# Patient Record
Sex: Male | Born: 1980 | Race: Black or African American | Hispanic: No | Marital: Single | Smoking: Current every day smoker
Health system: Southern US, Community
[De-identification: ages and names within clinical notes are randomized; demographics above are authoritative.]

## PROBLEM LIST (undated history)

## (undated) DIAGNOSIS — J45909 Unspecified asthma, uncomplicated: Secondary | ICD-10-CM

---

## 2001-09-22 ENCOUNTER — Emergency Department (HOSPITAL_COMMUNITY): Admission: EM | Admit: 2001-09-22 | Discharge: 2001-09-22 | Payer: Self-pay | Admitting: *Deleted

## 2001-10-05 ENCOUNTER — Encounter: Admission: RE | Admit: 2001-10-05 | Discharge: 2001-10-05 | Payer: Self-pay | Admitting: Internal Medicine

## 2001-10-05 ENCOUNTER — Ambulatory Visit (HOSPITAL_COMMUNITY): Admission: RE | Admit: 2001-10-05 | Discharge: 2001-10-05 | Payer: Self-pay | Admitting: Internal Medicine

## 2002-12-10 ENCOUNTER — Encounter: Payer: Self-pay | Admitting: Emergency Medicine

## 2002-12-10 ENCOUNTER — Emergency Department (HOSPITAL_COMMUNITY): Admission: EM | Admit: 2002-12-10 | Discharge: 2002-12-10 | Payer: Self-pay | Admitting: Emergency Medicine

## 2005-09-26 ENCOUNTER — Emergency Department (HOSPITAL_COMMUNITY): Admission: EM | Admit: 2005-09-26 | Discharge: 2005-09-26 | Payer: Self-pay | Admitting: Family Medicine

## 2006-02-10 ENCOUNTER — Inpatient Hospital Stay (HOSPITAL_COMMUNITY): Admission: AC | Admit: 2006-02-10 | Discharge: 2006-02-10 | Payer: Self-pay

## 2006-04-22 ENCOUNTER — Emergency Department (HOSPITAL_COMMUNITY): Admission: EM | Admit: 2006-04-22 | Discharge: 2006-04-22 | Payer: Self-pay | Admitting: Emergency Medicine

## 2007-03-29 ENCOUNTER — Emergency Department (HOSPITAL_COMMUNITY): Admission: EM | Admit: 2007-03-29 | Discharge: 2007-03-29 | Payer: Self-pay | Admitting: Emergency Medicine

## 2007-12-12 ENCOUNTER — Emergency Department (HOSPITAL_COMMUNITY): Admission: EM | Admit: 2007-12-12 | Discharge: 2007-12-12 | Payer: Self-pay | Admitting: Emergency Medicine

## 2008-01-03 ENCOUNTER — Emergency Department (HOSPITAL_COMMUNITY): Admission: EM | Admit: 2008-01-03 | Discharge: 2008-01-03 | Payer: Self-pay | Admitting: Emergency Medicine

## 2008-08-12 ENCOUNTER — Emergency Department (HOSPITAL_COMMUNITY): Admission: EM | Admit: 2008-08-12 | Discharge: 2008-08-12 | Payer: Self-pay | Admitting: Nurse Practitioner

## 2009-03-24 ENCOUNTER — Emergency Department (HOSPITAL_COMMUNITY): Admission: EM | Admit: 2009-03-24 | Discharge: 2009-03-24 | Payer: Self-pay | Admitting: Emergency Medicine

## 2009-04-08 ENCOUNTER — Emergency Department (HOSPITAL_COMMUNITY): Admission: EM | Admit: 2009-04-08 | Discharge: 2009-04-08 | Payer: Self-pay | Admitting: Emergency Medicine
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
## 2010-12-27 ENCOUNTER — Emergency Department (HOSPITAL_COMMUNITY): Payer: Self-pay

## 2010-12-27 ENCOUNTER — Emergency Department (HOSPITAL_COMMUNITY)
Admission: EM | Admit: 2010-12-27 | Discharge: 2010-12-27 | Disposition: A | Discharge status: Home / Self Care | Payer: Self-pay | Attending: Emergency Medicine | Admitting: Emergency Medicine

## 2010-12-27 DIAGNOSIS — Y9367 Activity, basketball: Secondary | ICD-10-CM | POA: Insufficient documentation

## 2010-12-27 DIAGNOSIS — M545 Low back pain, unspecified: Secondary | ICD-10-CM | POA: Insufficient documentation

## 2010-12-27 DIAGNOSIS — M549 Dorsalgia, unspecified: Secondary | ICD-10-CM | POA: Insufficient documentation

## 2010-12-27 DIAGNOSIS — M542 Cervicalgia: Secondary | ICD-10-CM | POA: Insufficient documentation

## 2010-12-27 DIAGNOSIS — J45909 Unspecified asthma, uncomplicated: Secondary | ICD-10-CM | POA: Insufficient documentation

## 2010-12-27 DIAGNOSIS — X58XXXA Exposure to other specified factors, initial encounter: Secondary | ICD-10-CM | POA: Insufficient documentation

## 2011-02-18 LAB — COMPREHENSIVE METABOLIC PANEL
Alkaline Phosphatase: 51
BUN: 6
CO2: 22
Chloride: 110
GFR calc non Af Amer: >60
Glucose, Bld: 96
Potassium: 4.3
Total Bilirubin: 0.7

## 2011-02-18 LAB — DIFFERENTIAL
Basophils Relative: 0
Eosinophils Absolute: 0.3
Monocytes Relative: 7
Neutrophils Relative %: 73

## 2011-02-18 LAB — LACTIC ACID, PLASMA: Lactic Acid, Venous: 1.9

## 2011-02-18 LAB — LIPASE, BLOOD: Lipase: 18

## 2011-02-18 LAB — ETHANOL

## 2011-02-18 LAB — CBC
Hemoglobin: 16
RBC: 4.88

## 2014-04-24 ENCOUNTER — Emergency Department (HOSPITAL_COMMUNITY): Payer: Self-pay

## 2014-04-24 ENCOUNTER — Emergency Department (HOSPITAL_COMMUNITY)
Admission: EM | Admit: 2014-04-24 | Discharge: 2014-04-24 | Disposition: A | Discharge status: Home / Self Care | Payer: Self-pay | Attending: Emergency Medicine | Admitting: Emergency Medicine

## 2014-04-24 ENCOUNTER — Encounter (HOSPITAL_COMMUNITY): Payer: Self-pay | Admitting: *Deleted

## 2014-04-24 DIAGNOSIS — J302 Other seasonal allergic rhinitis: Secondary | ICD-10-CM | POA: Insufficient documentation

## 2014-04-24 DIAGNOSIS — R0602 Shortness of breath: Secondary | ICD-10-CM

## 2014-04-24 DIAGNOSIS — J45901 Unspecified asthma with (acute) exacerbation: Secondary | ICD-10-CM | POA: Insufficient documentation

## 2014-04-24 DIAGNOSIS — Z720 Tobacco use: Secondary | ICD-10-CM | POA: Insufficient documentation

## 2014-04-24 DIAGNOSIS — Z79899 Other long term (current) drug therapy: Secondary | ICD-10-CM | POA: Insufficient documentation

## 2014-04-24 HISTORY — DX: Unspecified asthma, uncomplicated: J45.909

## 2014-04-24 MED ORDER — PREDNISONE 20 MG PO TABS
60.0000 mg | ORAL_TABLET | Freq: Once | ORAL | Status: AC
  Administered 2014-04-24: 60 mg via ORAL
  Filled 2014-04-24: qty 3

## 2014-04-24 MED ORDER — ALBUTEROL SULFATE (2.5 MG/3ML) 0.083% IN NEBU
2.5000 mg | INHALATION_SOLUTION | Freq: Once | RESPIRATORY_TRACT | Status: AC
  Administered 2014-04-24: 2.5 mg via RESPIRATORY_TRACT
  Filled 2014-04-24: qty 3

## 2014-04-24 MED ORDER — ALBUTEROL SULFATE HFA 108 (90 BASE) MCG/ACT IN AERS
1.0000 | INHALATION_SPRAY | RESPIRATORY_TRACT | Status: DC | PRN
  Administered 2014-04-24: 2 via RESPIRATORY_TRACT
  Filled 2014-04-24: qty 6.7

## 2014-04-24 MED ORDER — LORATADINE 10 MG PO TABS: 10.0000 mg | ORAL_TABLET | Freq: Every day | ORAL | Status: DC

## 2014-04-24 MED ORDER — PREDNISONE 20 MG PO TABS: ORAL_TABLET | ORAL | Status: DC

## 2014-04-24 MED ORDER — BENZONATATE 100 MG PO CAPS: 100.0000 mg | ORAL_CAPSULE | Freq: Three times a day (TID) | ORAL | Status: DC

## 2014-04-24 MED ORDER — LORATADINE 10 MG PO TABS
10.0000 mg | ORAL_TABLET | Freq: Once | ORAL | Status: AC
  Administered 2014-04-24: 10 mg via ORAL
  Filled 2014-04-24: qty 1

## 2014-04-24 NOTE — ED Notes (Signed)
The pt has a cold and a cough since this am.  He  Has run out of his inhaler.  When he lies down he cannot breathe.  Pain in chest with inspiration sinus draining non-productive cough

## 2014-04-24 NOTE — ED Provider Notes (Signed)
CSN: [id number]     Arrival date & time 04/24/14  0049 History  This chart was scribed for Loren Raceravid Johnisha Louks, MD by Bronson CurbJacqueline Melvin, ED Scribe. This patient was seen in room A10C/A10C and the patient's care was started at 1:33 AM.    Chief Complaint  Patient presents with  . Asthma    The history is provided by the patient. No language interpreter was used.     HPI Comments: Kirk Sheehanravis M Allen is a 33 y.o. male who presents to the Emergency Department complaining of persistent, nonproductive cough since this morning. There is associated nasal congestion, SOB, and chills. He also notes chest tightess with cough and deep breathing, and states his symptoms worsen when lying down. Patient has history of asthma and states he is currently out of his inhaler. Patient is a current everyday smoker.   Past Medical History  Diagnosis Date  . Asthma    History reviewed. No pertinent past surgical history. No family history on file. History  Substance Use Topics  . Smoking status: Current Every Day Smoker  . Smokeless tobacco: Not on file  . Alcohol Use: Yes    Review of Systems  Constitutional: Positive for chills. Negative for fever.  HENT: Positive for congestion and rhinorrhea. Negative for sinus pressure, sore throat and tinnitus.   Eyes: Positive for discharge. Negative for photophobia and visual disturbance.  Respiratory: Positive for cough, chest tightness, shortness of breath and wheezing.   Cardiovascular: Negative for chest pain.  Gastrointestinal: Negative for nausea, vomiting, abdominal pain, diarrhea and constipation.  Musculoskeletal: Negative for back pain, neck pain and neck stiffness.  Skin: Negative for rash and wound.  Neurological: Negative for dizziness, weakness, light-headedness, numbness and headaches.  All other systems reviewed and are negative.     Allergies  Review of patient's allergies indicates no known allergies.  Home Medications   Prior to Admission  medications   Medication Sig Start Date End Date Taking? Authorizing Provider  benzonatate (TESSALON) 100 MG capsule Take 1 capsule (100 mg total) by mouth every 8 (eight) hours. 04/24/14   Loren Raceravid Demitrius Crass, MD  loratadine (CLARITIN) 10 MG tablet Take 1 tablet (10 mg total) by mouth daily. 04/24/14   Loren Raceravid Corine Solorio, MD  predniSONE (DELTASONE) 20 MG tablet 3 tabs po day one, then 2 po daily x 4 days 04/24/14   Loren Raceravid Aliani Caccavale, MD   Triage Vitals: BP 144/92 mmHg  Pulse 104  Temp(Src) 98.1 F (36.7 C)  Resp 20  Wt 180 lb 6 oz (81.818 kg)  SpO2 99%  Physical Exam  Constitutional: He is oriented to person, place, and time. He appears well-developed and well-nourished. No distress.  HENT:  Head: Normocephalic and atraumatic.  Mouth/Throat: Oropharynx is clear and moist.  Bilateral nasal mucosal edema. No sinus tenderness with percussion.  Eyes: EOM are normal. Pupils are equal, round, and reactive to light.  Clear discharge bilateral eyes.  Neck: Normal range of motion. Neck supple.  Cardiovascular: Normal rate and regular rhythm.   Pulmonary/Chest: Effort normal. No respiratory distress. He has no wheezes. He has no rales. He exhibits no tenderness.  Mildly decreased air movement. No definite wheezing  Abdominal: Soft. Bowel sounds are normal. He exhibits no distension and no mass. There is no tenderness. There is no rebound and no guarding.  Musculoskeletal: Normal range of motion. He exhibits no edema or tenderness.  No calf swelling or tenderness.  Neurological: He is alert and oriented to person, place, and time.  Moves all  extremities without deficit. Sensation is grossly intact.  Skin: Skin is warm and dry. No rash noted. No erythema.  Psychiatric: He has a normal mood and affect. His behavior is normal.  Nursing note and vitals reviewed.   ED Course  Procedures (including critical care time)  DIAGNOSTIC STUDIES: Oxygen Saturation is 99% on room air, normal by my interpretation.     COORDINATION OF CARE: At 0136 Discussed treatment plan with patient. Patient agrees.   Labs Review Labs Reviewed - No data to display  Imaging Review Dg Chest 2 View  04/24/2014   CLINICAL DATA:  Acute onset of cough and shortness of breath. Current history of moderate asthma. Initial encounter.  EXAM: CHEST  2 VIEW  COMPARISON:  Chest radiograph performed 12/12/2007  FINDINGS: The lungs are well-aerated and clear. There is no evidence of focal opacification, pleural effusion or pneumothorax.  The heart is normal in size; the mediastinal contour is within normal limits. No acute osseous abnormalities are seen.  IMPRESSION: No acute cardiopulmonary process seen.   Electronically Signed   By: Roanna RaiderJeffery  Chang M.D.   On: 04/24/2014 01:54     EKG Interpretation None      MDM   Final diagnoses:  Seasonal allergies  Mild asthma exacerbation    I personally performed the services described in this documentation, which was scribed in my presence. The recorded information has been reviewed and is accurate.   Chest x-ray without acute findings. Vital signs remained stable. Mild improvement after nebulized treatment. We'll give inhaler in the emergency department. We'll treat for seasonal allergies and mild asthma exacerbation.  Loren Raceravid Surafel Hilleary, MD 04/24/14 [phone]

## 2014-04-24 NOTE — ED Notes (Signed)
Pt monitored by pulse ox, bp cuff, and 12-lead. 

## 2014-04-24 NOTE — ED Notes (Signed)
Pt a/o x 4 on d/c with steady gait. 

## 2014-04-24 NOTE — Discharge Instructions (Signed)
Allergic Rhinitis Allergic rhinitis is when the mucous membranes in the nose respond to allergens. Allergens are particles in the air that cause your body to have an allergic reaction. This causes you to release allergic antibodies. Through a chain of events, these eventually cause you to release histamine into the blood stream. Although meant to protect the body, it is this release of histamine that causes your discomfort, such as frequent sneezing, congestion, and an itchy, runny nose.  CAUSES  Seasonal allergic rhinitis (hay fever) is caused by pollen allergens that may come from grasses, trees, and weeds. Year-round allergic rhinitis (perennial allergic rhinitis) is caused by allergens such as house dust mites, pet dander, and mold spores.  SYMPTOMS   Nasal stuffiness (congestion).  Itchy, runny nose with sneezing and tearing of the eyes. DIAGNOSIS  Your health care provider can help you determine the allergen or allergens that trigger your symptoms. If you and your health care provider are unable to determine the allergen, skin or blood testing may be used. TREATMENT  Allergic rhinitis does not have a cure, but it can be controlled by:  Medicines and allergy shots (immunotherapy).  Avoiding the allergen. Hay fever may often be treated with antihistamines in pill or nasal spray forms. Antihistamines block the effects of histamine. There are over-the-counter medicines that may help with nasal congestion and swelling around the eyes. Check with your health care provider before taking or giving this medicine.  If avoiding the allergen or the medicine prescribed do not work, there are many new medicines your health care provider can prescribe. Stronger medicine may be used if initial measures are ineffective. Desensitizing injections can be used if medicine and avoidance does not work. Desensitization is when a patient is given ongoing shots until the body becomes less sensitive to the allergen.  Make sure you follow up with your health care provider if problems continue. HOME CARE INSTRUCTIONS It is not possible to completely avoid allergens, but you can reduce your symptoms by taking steps to limit your exposure to them. It helps to know exactly what you are allergic to so that you can avoid your specific triggers. SEEK MEDICAL CARE IF:   You have a fever.  You develop a cough that does not stop easily (persistent).  You have shortness of breath.  You start wheezing.  Symptoms interfere with normal daily activities. Document Released: 02/01/2001 Document Revised: 05/14/2013 Document Reviewed: 01/14/2013 [facility] Patient Information 2015 [place], [place]. This information is not intended to replace advice given to you by your health care provider. Make sure you discuss any questions you have with your health care provider.  Asthma Asthma is a recurring condition in which the airways tighten and narrow. Asthma can make it difficult to breathe. It can cause coughing, wheezing, and shortness of breath. Asthma episodes, also called asthma attacks, range from minor to life-threatening. Asthma cannot be cured, but medicines and lifestyle changes can help control it. CAUSES Asthma is believed to be caused by inherited (genetic) and environmental factors, but its exact cause is unknown. Asthma may be triggered by allergens, lung infections, or irritants in the air. Asthma triggers are different for each person. Common triggers include:   Animal dander.  Dust mites.  Cockroaches.  Pollen from trees or grass.  Mold.  Smoke.  Air pollutants such as dust, household cleaners, hair sprays, aerosol sprays, paint fumes, strong chemicals, or strong odors.  Cold air, weather changes, and winds (which increase molds and pollens in the air).  Strong emotional expressions such as crying or laughing hard.  Stress.  Certain medicines (such as aspirin) or types of drugs (such as  beta-blockers).  Sulfites in foods and drinks. Foods and drinks that may contain sulfites include dried fruit, potato chips, and sparkling grape juice.  Infections or inflammatory conditions such as the flu, a cold, or an inflammation of the nasal membranes (rhinitis).  Gastroesophageal reflux disease (GERD).  Exercise or strenuous activity. SYMPTOMS Symptoms may occur immediately after asthma is triggered or many hours later. Symptoms include:  Wheezing.  Excessive nighttime or early morning coughing.  Frequent or severe coughing with a common cold.  Chest tightness.  Shortness of breath. DIAGNOSIS  The diagnosis of asthma is made by a review of your medical history and a physical exam. Tests may also be performed. These may include:  Lung function studies. These tests show how much air you breathe in and out.  Allergy tests.  Imaging tests such as X-rays. TREATMENT  Asthma cannot be cured, but it can usually be controlled. Treatment involves identifying and avoiding your asthma triggers. It also involves medicines. There are 2 classes of medicine used for asthma treatment:   Controller medicines. These prevent asthma symptoms from occurring. They are usually taken every day.  Reliever or rescue medicines. These quickly relieve asthma symptoms. They are used as needed and provide short-term relief. Your health care provider will help you create an asthma action plan. An asthma action plan is a written plan for managing and treating your asthma attacks. It includes a list of your asthma triggers and how they may be avoided. It also includes information on when medicines should be taken and when their dosage should be changed. An action plan may also involve the use of a device called a peak flow meter. A peak flow meter measures how well the lungs are working. It helps you monitor your condition. HOME CARE INSTRUCTIONS   Take medicines only as directed by your health care  provider. Speak with your health care provider if you have questions about how or when to take the medicines.  Use a peak flow meter as directed by your health care provider. Record and keep track of readings.  Understand and use the action plan to help minimize or stop an asthma attack without needing to seek medical care.  Control your home environment in the following ways to help prevent asthma attacks:  Do not smoke. Avoid being exposed to secondhand smoke.  Change your heating and air conditioning filter regularly.  Limit your use of fireplaces and wood stoves.  Get rid of pests (such as roaches and mice) and their droppings.  Throw away plants if you see mold on them.  Clean your floors and dust regularly. Use unscented cleaning products.  Try to have someone else vacuum for you regularly. Stay out of rooms while they are being vacuumed and for a short while afterward. If you vacuum, use a dust mask from a hardware store, a double-layered or microfilter vacuum cleaner bag, or a vacuum cleaner with a HEPA filter.  Replace carpet with wood, tile, or vinyl flooring. Carpet can trap dander and dust.  Use allergy-proof pillows, mattress covers, and box spring covers.  Wash bed sheets and blankets every week in hot water and dry them in a dryer.  Use blankets that are made of polyester or cotton.  Clean bathrooms and kitchens with bleach. If possible, have someone repaint the walls in these rooms with mold-resistant paint.  Keep out of the rooms that are being cleaned and painted.  Wash hands frequently. SEEK MEDICAL CARE IF:   You have wheezing, shortness of breath, or a cough even if taking medicine to prevent attacks.  The colored mucus you cough up (sputum) is thicker than usual.  Your sputum changes from clear or white to yellow, green, gray, or bloody.  You have any problems that may be related to the medicines you are taking (such as a rash, itching, swelling, or  trouble breathing).  You are using a reliever medicine more than 2-3 times per week.  Your peak flow is still at 50-79% of your personal best after following your action plan for 1 hour.  You have a fever. SEEK IMMEDIATE MEDICAL CARE IF:   You seem to be getting worse and are unresponsive to treatment during an asthma attack.  You are short of breath even at rest.  You get short of breath when doing very little physical activity.  You have difficulty eating, drinking, or talking due to asthma symptoms.  You develop chest pain.  You develop a fast heartbeat.  You have a bluish color to your lips or fingernails.  You are light-headed, dizzy, or faint.  Your peak flow is less than 50% of your personal best. MAKE SURE YOU:   Understand these instructions.  Will watch your condition.  Will get help right away if you are not doing well or get worse. Document Released: 05/09/2005 Document Revised: 09/23/2013 Document Reviewed: 12/06/2012 [employer] Patient Information 2015 [place], [place]. This information is not intended to replace advice given to you by your health care provider. Make sure you discuss any questions you have with your health care provider.

## 2016-07-17 ENCOUNTER — Emergency Department (HOSPITAL_COMMUNITY): Payer: Self-pay

## 2016-07-17 ENCOUNTER — Emergency Department (HOSPITAL_COMMUNITY)
Admission: EM | Admit: 2016-07-17 | Discharge: 2016-07-17 | Disposition: A | Discharge status: Home / Self Care | Payer: Self-pay | Attending: Emergency Medicine | Admitting: Emergency Medicine

## 2016-07-17 ENCOUNTER — Encounter (HOSPITAL_COMMUNITY): Payer: Self-pay | Admitting: Emergency Medicine

## 2016-07-17 DIAGNOSIS — J45909 Unspecified asthma, uncomplicated: Secondary | ICD-10-CM | POA: Insufficient documentation

## 2016-07-17 DIAGNOSIS — J111 Influenza due to unidentified influenza virus with other respiratory manifestations: Secondary | ICD-10-CM | POA: Insufficient documentation

## 2016-07-17 DIAGNOSIS — F17200 Nicotine dependence, unspecified, uncomplicated: Secondary | ICD-10-CM | POA: Insufficient documentation

## 2016-07-17 DIAGNOSIS — R69 Illness, unspecified: Secondary | ICD-10-CM

## 2016-07-17 DIAGNOSIS — N179 Acute kidney failure, unspecified: Secondary | ICD-10-CM | POA: Insufficient documentation

## 2016-07-17 LAB — COMPREHENSIVE METABOLIC PANEL
ALBUMIN: 4 g/dL (ref 3.5–5.0)
ALT: 32 U/L (ref 17–63)
ANION GAP: 13 (ref 5–15)
AST: 47 U/L — ABNORMAL HIGH (ref 15–41)
Alkaline Phosphatase: 42 U/L (ref 38–126)
BUN: 9 mg/dL (ref 6–20)
CALCIUM: 9.1 mg/dL (ref 8.9–10.3)
CO2: 21 mmol/L — AB (ref 22–32)
Chloride: 97 mmol/L — ABNORMAL LOW (ref 101–111)
Creatinine, Ser: 1.29 mg/dL — ABNORMAL HIGH (ref 0.61–1.24)
GFR calc non Af Amer: >60 mL/min (ref >60)
GLUCOSE: 142 mg/dL — AB (ref 65–99)
POTASSIUM: 3.9 mmol/L (ref 3.5–5.1)
SODIUM: 131 mmol/L — AB (ref 135–145)
Total Bilirubin: 0.8 mg/dL (ref 0.3–1.2)
Total Protein: 7.2 g/dL (ref 6.5–8.1)

## 2016-07-17 LAB — CBC WITH DIFFERENTIAL/PLATELET
BASOS ABS: 0 10*3/uL (ref 0.0–0.1)
BASOS PCT: 0 %
Eosinophils Absolute: 0 10*3/uL (ref 0.0–0.7)
Eosinophils Relative: 0 %
HEMATOCRIT: 42.8 % (ref 39.0–52.0)
HEMOGLOBIN: 14.8 g/dL (ref 13.0–17.0)
LYMPHS PCT: 19 %
Lymphs Abs: 1.6 10*3/uL (ref 0.7–4.0)
MCH: 32.1 pg (ref 26.0–34.0)
MCHC: 34.6 g/dL (ref 30.0–36.0)
MCV: 92.8 fL (ref 78.0–100.0)
MONO ABS: 0.7 10*3/uL (ref 0.1–1.0)
MONOS PCT: 8 %
NEUTROS ABS: 6.1 10*3/uL (ref 1.7–7.7)
NEUTROS PCT: 73 %
Platelets: 172 10*3/uL (ref 150–400)
RBC: 4.61 MIL/uL (ref 4.22–5.81)
RDW: 12.7 % (ref 11.5–15.5)
WBC: 8.3 10*3/uL (ref 4.0–10.5)

## 2016-07-17 LAB — URINALYSIS, ROUTINE W REFLEX MICROSCOPIC
GLUCOSE, UA: NEGATIVE mg/dL
KETONES UR: 80 mg/dL — AB
LEUKOCYTES UA: NEGATIVE
NITRITE: NEGATIVE
PROTEIN: 30 mg/dL — AB
Specific Gravity, Urine: 1.031 — ABNORMAL HIGH (ref 1.005–1.030)
pH: 5 (ref 5.0–8.0)

## 2016-07-17 LAB — I-STAT CG4 LACTIC ACID, ED: LACTIC ACID, VENOUS: 1.69 mmol/L (ref 0.5–1.9)

## 2016-07-17 MED ORDER — SODIUM CHLORIDE 0.9 % IV BOLUS (SEPSIS)
2000.0000 mL | Freq: Once | INTRAVENOUS | Status: AC
  Administered 2016-07-17: 2000 mL via INTRAVENOUS

## 2016-07-17 MED ORDER — IBUPROFEN 800 MG PO TABS: 800.0000 mg | ORAL_TABLET | Freq: Once | ORAL | Status: DC

## 2016-07-17 NOTE — ED Notes (Signed)
ED Provider at bedside. 

## 2016-07-17 NOTE — ED Provider Notes (Signed)
[facility]-EMERGENCY DEPT Provider Note   CSN: [id number] Arrival date & time: 07/17/16 [id number]     History    Chief Complaint  Patient presents with  . Cough  . flu like symptoms     HPI Kirk Allen is a 36 y.o. male.  36yo M w/ asthma who p/w Often viral symptoms. The patient reports several days of cough productive of brown sputum, subjective fevers, intermittent sore throat, congestion, and body aches/chills. He denies any vomiting or diarrhea. No breathing problems. No sick contacts. He has been urinating less than usual. No recent travel. No skin rash.   Past Medical History:  Diagnosis Date  . Asthma      There are no active problems to display for this patient.   History reviewed. No pertinent surgical history.      Home Medications    Prior to Admission medications   Medication Sig Start Date End Date Taking? Authorizing Provider  benzonatate (TESSALON) 100 MG capsule Take 1 capsule (100 mg total) by mouth every 8 (eight) hours. 04/24/14   Loren Raceravid Yelverton, MD  loratadine (CLARITIN) 10 MG tablet Take 1 tablet (10 mg total) by mouth daily. 04/24/14   Loren Raceravid Yelverton, MD  predniSONE (DELTASONE) 20 MG tablet 3 tabs po day one, then 2 po daily x 4 days 04/24/14   Loren Raceravid Yelverton, MD      History reviewed. No pertinent family history.   Social History  Substance Use Topics  . Smoking status: Current Every Day Smoker  . Smokeless tobacco: Not on file  . Alcohol use Yes     Comment: social     Allergies     Patient has no known allergies.    Review of Systems  10 Systems reviewed and are negative for acute change except as noted in the HPI.   Physical Exam Updated Vital Signs BP 110/86   Pulse 81   Temp 98.9 F (37.2 C) (Oral)   Resp 18   Ht 6\' 2"  (1.88 m)   Wt 185 lb (83.9 kg)   SpO2 98%   BMI 23.75 kg/m   Physical Exam  Constitutional: He is oriented to person, place, and time. He appears well-developed and well-nourished. No distress.    HENT:  Head: Normocephalic and atraumatic.  Mildly dry mucous membranes  Eyes: Conjunctivae are normal. Pupils are equal, round, and reactive to light.  Neck: Neck supple.  Cardiovascular: Normal rate, regular rhythm and normal heart sounds.   No murmur heard. Pulmonary/Chest: Effort normal and breath sounds normal. He has no wheezes.  Abdominal: Soft. Bowel sounds are normal. He exhibits no distension. There is no tenderness.  Musculoskeletal: He exhibits no edema.  Neurological: He is alert and oriented to person, place, and time.  Fluent speech  Skin: Skin is warm and dry.  Psychiatric: He has a normal mood and affect. Judgment normal.  Nursing note and vitals reviewed.     ED Treatments / Results  Labs (all labs ordered are listed, but only abnormal results are displayed) Labs Reviewed  COMPREHENSIVE METABOLIC PANEL - Abnormal; Notable for the following:       Result Value   Sodium 131 (*)    Chloride 97 (*)    CO2 21 (*)    Glucose, Bld 142 (*)    Creatinine, Ser 1.29 (*)    AST 47 (*)    All other components within normal limits  URINALYSIS, ROUTINE W REFLEX MICROSCOPIC - Abnormal; Notable for the following:  APPearance HAZY (*)    Specific Gravity, Urine 1.031 (*)    Hgb urine dipstick SMALL (*)    Bilirubin Urine SMALL (*)    Ketones, ur 80 (*)    Protein, ur 30 (*)    Bacteria, UA RARE (*)    Squamous Epithelial / LPF 6-30 (*)    All other components within normal limits  CBC WITH DIFFERENTIAL/PLATELET  I-STAT CG4 LACTIC ACID, ED     EKG  EKG Interpretation  Date/Time:    Ventricular Rate:    PR Interval:    QRS Duration:   QT Interval:    QTC Calculation:   R Axis:     Text Interpretation:           Radiology Dg Chest 2 View  Result Date: 07/17/2016 CLINICAL DATA:  Cough and chest pain EXAM: CHEST  2 VIEW COMPARISON:  04/24/2014 FINDINGS: The heart size and mediastinal contours are within normal limits. Both lungs are clear. The  visualized skeletal structures are unremarkable. IMPRESSION: No active cardiopulmonary disease. Electronically Signed   By: Signa Kell M.D.   On: 07/17/2016 19:45    Procedures Procedures (including critical care time) Procedures  Medications Ordered in ED  Medications  sodium chloride 0.9 % bolus 2,000 mL (2,000 mLs Intravenous New Bag/Given 07/17/16 2126)     Initial Impression / Assessment and Plan / ED Course  I have reviewed the triage vital signs and the nursing notes.  Pertinent labs & imaging results that were available during my care of the patient were reviewed by me and considered in my medical decision making (see chart for details).     Pt w/ ILI including active cough, body aches, and subjective fevers. He was nontoxic on exam, neck supple, vital signs normal. He had clear breath sounds and no wheezing. His labs are notable for mild AK I with creatinine 1.29, UA with ketones and protein suggestive of mild dehydration. CBC normal. Chest x-ray negative. His symptoms are consistent with influenza or influenza-like illness and he is otherwise compensating well with no breathing difficulties. He is able to tolerate liquids. Gave the patient an IV fluid bolus and discussed supportive care at home including continued hydration, Tylenol/Motrin as needed. Reviewed return precautions including any respiratory distress, dehydration, or worsening symptoms. Patient voiced understanding and was discharged in satisfactory condition.  Final Clinical Impressions(s) / ED Diagnoses   Final diagnoses:  Influenza-like illness  AKI (acute kidney injury) [facility])     New Prescriptions   No medications on file       Laurence Spates, MD 07/17/16 2249

## 2016-07-17 NOTE — ED Triage Notes (Signed)
Pt presents from home with flu like symptoms x couple days; pt reports productive cough with brown sputum and subjective fevers

## 2016-07-17 NOTE — ED Notes (Signed)
EDP at bedside  

## 2018-04-11 ENCOUNTER — Other Ambulatory Visit: Payer: Self-pay

## 2018-04-11 ENCOUNTER — Encounter (HOSPITAL_COMMUNITY): Payer: Self-pay | Admitting: Emergency Medicine

## 2018-04-11 ENCOUNTER — Emergency Department (HOSPITAL_COMMUNITY): Payer: Self-pay

## 2018-04-11 ENCOUNTER — Emergency Department (HOSPITAL_COMMUNITY)
Admission: EM | Admit: 2018-04-11 | Discharge: 2018-04-11 | Disposition: A | Discharge status: Home / Self Care | Payer: Self-pay | Attending: Emergency Medicine | Admitting: Emergency Medicine

## 2018-04-11 DIAGNOSIS — Z79899 Other long term (current) drug therapy: Secondary | ICD-10-CM | POA: Insufficient documentation

## 2018-04-11 DIAGNOSIS — M542 Cervicalgia: Secondary | ICD-10-CM

## 2018-04-11 DIAGNOSIS — M62838 Other muscle spasm: Secondary | ICD-10-CM | POA: Insufficient documentation

## 2018-04-11 DIAGNOSIS — J45909 Unspecified asthma, uncomplicated: Secondary | ICD-10-CM | POA: Insufficient documentation

## 2018-04-11 MED ORDER — NAPROXEN 500 MG PO TABS: 500.0000 mg | ORAL_TABLET | Freq: Two times a day (BID) | ORAL | 0 refills | Status: AC

## 2018-04-11 MED ORDER — METHOCARBAMOL 500 MG PO TABS: 500.0000 mg | ORAL_TABLET | Freq: Two times a day (BID) | ORAL | 0 refills | Status: AC

## 2018-04-11 MED ORDER — KETOROLAC TROMETHAMINE 60 MG/2ML IM SOLN
30.0000 mg | Freq: Once | INTRAMUSCULAR | Status: AC
  Administered 2018-04-11: 30 mg via INTRAMUSCULAR
  Filled 2018-04-11: qty 2

## 2018-04-11 NOTE — ED Provider Notes (Signed)
[facility] EMERGENCY DEPARTMENT Provider Note   CSN: [id number] Arrival date & time: 04/11/18  1800     History   Chief Complaint Chief Complaint  Patient presents with  . Neck Pain    HPI Kirk Allen is a 37 y.o. male.  38 y.o male with a PMH of asthma presents to the ED with a chief complaint of neck pain x 5 days. Patient works in [employer] reports he does a lot of heavy lifting.  Today he reports a left shooting pain from the back of his neck along to the right side of his neck. Describes the pain as intermittent sharp which is relieved with hot water. He reports the pain is worse when he lays down or when he moves. Patient has not tried any medical therapy to help with his pain.  He denies any fever, headaches, photophobia, chest pain or shortness of breath.     Past Medical History:  Diagnosis Date  . Asthma     There are no active problems to display for this patient.   History reviewed. No pertinent surgical history.      Home Medications    Prior to Admission medications   Medication Sig Start Date End Date Taking? Authorizing Provider  benzonatate (TESSALON) 100 MG capsule Take 1 capsule (100 mg total) by mouth every 8 (eight) hours. 04/24/14   Loren Racer, MD  loratadine (CLARITIN) 10 MG tablet Take 1 tablet (10 mg total) by mouth daily. 04/24/14   Loren Racer, MD  methocarbamol (ROBAXIN) 500 MG tablet Take 1 tablet (500 mg total) by mouth 2 (two) times daily for 7 days. 04/11/18 04/18/18  Claude Manges, PA-C  predniSONE (DELTASONE) 20 MG tablet 3 tabs po day one, then 2 po daily x 4 days 04/24/14   Loren Racer, MD    Family History History reviewed. No pertinent family history.  Social History Social History   Tobacco Use  . Smoking status: Current Every Day Smoker    Packs/day: 0.50  . Smokeless tobacco: Never Used  Substance Use Topics  . Alcohol use: Yes    Comment: social  . Drug use: No      Allergies   Patient has no known allergies.   Review of Systems Review of Systems  Constitutional: Negative for fever.  Musculoskeletal: Positive for myalgias and neck pain.  Neurological: Negative for headaches.     Physical Exam Updated Vital Signs BP 136/89 (BP Location: Right Arm)   Pulse 78   Temp 97.9 F (36.6 C) (Oral)   Resp 17   SpO2 97%   Physical Exam  Constitutional: He is oriented to person, place, and time. He appears well-developed and well-nourished.  HENT:  Head: Normocephalic and atraumatic.  Mouth/Throat: Oropharynx is clear and moist.  Eyes: Pupils are equal, round, and reactive to light. No scleral icterus.  Neck: Normal range of motion. Spinous process tenderness and muscular tenderness present. No neck rigidity. No edema, no erythema and normal range of motion present. No Brudzinski's sign and no Kernig's sign noted.    TTP along the right and left side of his neck. Full ROM of neck. 5/5 strength on flexion and extension of forearm.   Cardiovascular: Normal heart sounds.  Pulmonary/Chest: Effort normal and breath sounds normal. He has no wheezes. He exhibits no tenderness.  Abdominal: Soft. Bowel sounds are normal. He exhibits no distension. There is no tenderness.  Musculoskeletal: He exhibits no tenderness or deformity.  Neurological: He is  alert and oriented to person, place, and time.  Skin: Skin is warm and dry.  Nursing note and vitals reviewed.    ED Treatments / Results  Labs (all labs ordered are listed, but only abnormal results are displayed) Labs Reviewed - No data to display  EKG None  Radiology No results found.  Procedures Procedures (including critical care time)  Medications Ordered in ED Medications  ketorolac (TORADOL) injection 30 mg (has no administration in time range)     Initial Impression / Assessment and Plan / ED Course  I have reviewed the triage vital signs and the nursing notes.  Pertinent  labs & imaging results that were available during my care of the patient were reviewed by me and considered in my medical decision making (see chart for details).    Presents with new acute onset of neck pain x5 days.  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  He reports the pain is worse when he lays down flat.  On evaluation there is significant tenderness along the right and left neck region no joint left or right involvement.  Patient has full range of motion of his shoulders.  Pulses are present and his strength is 5/5 bilaterally.  Provide patient with medication IV Toradol to help with his pain along with obtain a x-ray of his neck to rule out any disc compression or collapsing.  DG c spine showed There is no evidence of cervical spine fracture or prevertebral soft  tissue swelling. Alignment is normal. No other significant bone  abnormalities are identified. Minimal degenerative disc disease with  disc height loss at C7-T1. Bilateral neural foramina are patent.     Advised patient that I will send him home with a prescription for muscle relaxers along with rice therapy.  Patient is advised to follow-up with his primary care physician.  Given the community health and wellness this patient does not have a current PCP.  Patient understands and agrees with planning.  Patient stable for discharge.  Final Clinical Impressions(s) / ED Diagnoses   Final diagnoses:  Neck pain  Muscle spasms of neck    ED Discharge Orders         Ordered    methocarbamol (ROBAXIN) 500 MG tablet  2 times daily     04/11/18 1843           Claude MangesSoto, Coutney Wildermuth, PA-C 04/11/18 2037    Virgina Norfolkuratolo, Adam, DO 04/12/18 [id number]

## 2018-04-11 NOTE — ED Triage Notes (Signed)
Pt reports R neck pain radiating to back and R shoulder x 5 days. Pt denies any trauma, car accidents, or heavy lifting. Pt denies any change in urination.

## 2018-04-11 NOTE — Discharge Instructions (Addendum)
I have prescribed muscle relaxers for your pain, please do not drink or drive while taking this medications as they can make you drowsy.  Please follow-up with PCP in 1 week for reevaluation of your symptoms.  If you experience any fever, shortness of breath or chest pain you may return to the ED for reevaluation.

## 2018-04-11 NOTE — ED Notes (Signed)
Patient transported to X-ray 

## 2020-06-08 ENCOUNTER — Emergency Department (HOSPITAL_COMMUNITY)
Admission: EM | Admit: 2020-06-08 | Discharge: 2020-06-09 | Disposition: A | Discharge status: Home / Self Care | Payer: Self-pay | Attending: Emergency Medicine | Admitting: Emergency Medicine

## 2020-06-08 ENCOUNTER — Encounter (HOSPITAL_COMMUNITY): Payer: Self-pay

## 2020-06-08 ENCOUNTER — Emergency Department (HOSPITAL_COMMUNITY): Payer: Self-pay

## 2020-06-08 ENCOUNTER — Other Ambulatory Visit: Payer: Self-pay

## 2020-06-08 DIAGNOSIS — Z20822 Contact with and (suspected) exposure to covid-19: Secondary | ICD-10-CM | POA: Insufficient documentation

## 2020-06-08 DIAGNOSIS — J45909 Unspecified asthma, uncomplicated: Secondary | ICD-10-CM | POA: Insufficient documentation

## 2020-06-08 DIAGNOSIS — Z5321 Procedure and treatment not carried out due to patient leaving prior to being seen by health care provider: Secondary | ICD-10-CM | POA: Insufficient documentation

## 2020-06-08 LAB — BASIC METABOLIC PANEL
Anion gap: 10 (ref 5–15)
BUN: 8 mg/dL (ref 6–20)
CO2: 25 mmol/L (ref 22–32)
Calcium: 9.5 mg/dL (ref 8.9–10.3)
Chloride: 105 mmol/L (ref 98–111)
Creatinine, Ser: 1.06 mg/dL (ref 0.61–1.24)
GFR, Estimated: >60 mL/min (ref >60)
Glucose, Bld: 92 mg/dL (ref 70–99)
Potassium: 3.8 mmol/L (ref 3.5–5.1)
Sodium: 140 mmol/L (ref 135–145)

## 2020-06-08 LAB — CBC
HCT: 46.7 % (ref 39.0–52.0)
Hemoglobin: 15.9 g/dL (ref 13.0–17.0)
MCH: 33.3 pg (ref 26.0–34.0)
MCHC: 34 g/dL (ref 30.0–36.0)
MCV: 97.7 fL (ref 80.0–100.0)
Platelets: 244 10*3/uL (ref 150–400)
RBC: 4.78 MIL/uL (ref 4.22–5.81)
RDW: 13 % (ref 11.5–15.5)
WBC: 9.6 10*3/uL (ref 4.0–10.5)
nRBC: 0 % (ref 0.0–0.2)

## 2020-06-08 LAB — POC SARS CORONAVIRUS 2 AG -  ED: SARS Coronavirus 2 Ag: NEGATIVE

## 2020-06-08 MED ORDER — ALBUTEROL SULFATE (2.5 MG/3ML) 0.083% IN NEBU
2.5000 mg | INHALATION_SOLUTION | Freq: Once | RESPIRATORY_TRACT | Status: AC
  Administered 2020-06-08: 2.5 mg via RESPIRATORY_TRACT
  Filled 2020-06-08: qty 3

## 2020-06-08 MED ORDER — ALBUTEROL SULFATE HFA 108 (90 BASE) MCG/ACT IN AERS
2.0000 | INHALATION_SPRAY | RESPIRATORY_TRACT | Status: DC | PRN
  Administered 2020-06-08: 2 via RESPIRATORY_TRACT
  Filled 2020-06-08: qty 6.7

## 2020-06-08 NOTE — ED Triage Notes (Signed)
Pt states that his brother was cooking and it was smokey in the house and his asthma began to bother him, pt ran out of his inhaler today, audible wheezing. Pt vaccinated

## 2020-06-09 NOTE — ED Notes (Signed)
Pt checked out AMA. 

## 2020-11-03 ENCOUNTER — Emergency Department (HOSPITAL_COMMUNITY)
Admission: EM | Admit: 2020-11-03 | Discharge: 2020-11-03 | Disposition: A | Discharge status: Home / Self Care | Payer: Self-pay | Attending: Emergency Medicine | Admitting: Emergency Medicine

## 2020-11-03 ENCOUNTER — Emergency Department (HOSPITAL_COMMUNITY): Payer: Self-pay

## 2020-11-03 ENCOUNTER — Encounter (HOSPITAL_COMMUNITY): Payer: Self-pay | Admitting: *Deleted

## 2020-11-03 DIAGNOSIS — J45901 Unspecified asthma with (acute) exacerbation: Secondary | ICD-10-CM | POA: Insufficient documentation

## 2020-11-03 DIAGNOSIS — Z20822 Contact with and (suspected) exposure to covid-19: Secondary | ICD-10-CM | POA: Insufficient documentation

## 2020-11-03 DIAGNOSIS — F17210 Nicotine dependence, cigarettes, uncomplicated: Secondary | ICD-10-CM | POA: Insufficient documentation

## 2020-11-03 LAB — RESP PANEL BY RT-PCR (FLU A&B, COVID) ARPGX2
Influenza A by PCR: NEGATIVE
Influenza B by PCR: NEGATIVE
SARS Coronavirus 2 by RT PCR: NEGATIVE

## 2020-11-03 MED ORDER — PREDNISONE 20 MG PO TABS
60.0000 mg | ORAL_TABLET | Freq: Once | ORAL | Status: AC
  Administered 2020-11-03: 60 mg via ORAL
  Filled 2020-11-03: qty 3

## 2020-11-03 MED ORDER — AEROCHAMBER PLUS FLO-VU LARGE MISC
Status: AC
  Administered 2020-11-03: 1
  Filled 2020-11-03: qty 1

## 2020-11-03 MED ORDER — ALBUTEROL SULFATE HFA 108 (90 BASE) MCG/ACT IN AERS
4.0000 | INHALATION_SPRAY | Freq: Once | RESPIRATORY_TRACT | Status: AC
  Administered 2020-11-03: 05:00:00 4 via RESPIRATORY_TRACT
  Filled 2020-11-03: qty 6.7

## 2020-11-03 MED ORDER — AEROCHAMBER PLUS FLO-VU LARGE MISC: 1.0000 | Freq: Once | Status: AC

## 2020-11-03 MED ORDER — PREDNISONE 20 MG PO TABS: 60.0000 mg | ORAL_TABLET | Freq: Every day | ORAL | 0 refills | Status: AC

## 2020-11-03 MED ORDER — IPRATROPIUM-ALBUTEROL 0.5-2.5 (3) MG/3ML IN SOLN
3.0000 mL | Freq: Once | RESPIRATORY_TRACT | Status: AC
  Administered 2020-11-03: 3 mL via RESPIRATORY_TRACT
  Filled 2020-11-03: qty 3

## 2020-11-03 NOTE — ED Provider Notes (Signed)
Emergency Medicine Provider Triage Evaluation Note  Kirk Allen , a 40 y.o. male  was evaluated in triage.  Pt complaints of his asthma. States his father was burning lavender incense which might have triggered his sxs including dyspnea, wheezing, & chest tightness. Feels like his asthma. Does not have his inhaler.    Review of Systems  Positive: Dyspnea, wheezing, chest tightness.  Negative: Fever, cough, leg pain/swelling.   Physical Exam  BP (!) 160/103 (BP Location: Right Arm)   Pulse 92   Temp (!) 97.5 F (36.4 C) (Oral)   Resp (!) 26   SpO2 96%  Gen:   Awake, alert Resp:  Mild tachypnea with expiratory wheezing throughout, does not appear in acute respiratory distress.  MSK:   Moves extremities without difficulty, no LE edema.  Other:   Heart: RRR  Medical Decision Making  Medically screening exam initiated at 4:26 AM.  Appropriate orders placed.  THANH MOTTERN was informed that the remainder of the evaluation will be completed by another provider, this initial triage assessment does not replace that evaluation, and the importance of remaining in the ED until their evaluation is complete.   CXR & EKG ordered. Will give albuterol inhaler in triage. Covid test ordered to help facilitate neb txs if needed following in haler.   Wheezing.    Desmond Lope 11/03/20 0430    Mesner, Barbara Cower, MD 11/03/20 [phone]

## 2020-11-03 NOTE — ED Triage Notes (Signed)
Pt reports hx of asthma and is out of his inhaler. Onset 11pm of increase in sob with wheezing. Audible wheezing noted at triage. Spo2 96%.

## 2020-11-03 NOTE — Discharge Instructions (Addendum)
Tobi Bastos looking her again take steroids once daily for the next 5 days.  Use albuterol inhaler once every 4-6 hours for the next 24 hours and then as needed.  Follow-up with your primary care doctor for further management of your asthma.  Avoid burning incense this may have triggered this exacerbation.

## 2020-11-03 NOTE — ED Provider Notes (Signed)
[facility] EMERGENCY DEPARTMENT Provider Note   CSN: [id number] Arrival date & time: 11/03/20  0415     History Chief Complaint  Patient presents with   Asthma    Kirk Allen is a 40 y.o. male.  Kirk Allen is a 40 y.o. male with a history of asthma, who presents to the ED with concern for asthma exacerbation.  Patient reports that yesterday his father was burning some lavender incense in the house and after being around this he started feeling increasingly short of breath with wheezing, chest tightness and dry cough.  He reports this feels exactly like his prior asthma exacerbations, he thinks the incense may have triggered this.  No associated fevers.  No known sick contacts.  Aside from tightness with wheezing no other chest pain.  Was out of his inhaler.  Received albuterol in triage with some improvement.  The history is provided by the patient and medical records.      Past Medical History:  Diagnosis Date   Asthma     There are no problems to display for this patient.   History reviewed. No pertinent surgical history.     History reviewed. No pertinent family history.  Social History   Tobacco Use   Smoking status: Every Day    Packs/day: 0.50    Pack years: 0.00    Types: Cigarettes   Smokeless tobacco: Never  Vaping Use   Vaping Use: Never used  Substance Use Topics   Alcohol use: Yes    Comment: social   Drug use: No    Home Medications Prior to Admission medications   Medication Sig Start Date End Date Taking? Authorizing Provider  predniSONE (DELTASONE) 20 MG tablet Take 3 tablets (60 mg total) by mouth daily for 5 days. 11/03/20 11/08/20 Yes Dartha Lodge, PA-C  benzonatate (TESSALON) 100 MG capsule Take 1 capsule (100 mg total) by mouth every 8 (eight) hours. 04/24/14   Loren Racer, MD  loratadine (CLARITIN) 10 MG tablet Take 1 tablet (10 mg total) by mouth daily. 04/24/14   Loren Racer, MD    Allergies     Patient has no known allergies.  Review of Systems   Review of Systems  Constitutional:  Negative for chills and fever.  HENT:  Negative for congestion, rhinorrhea and sore throat.   Respiratory:  Positive for cough, chest tightness, shortness of breath and wheezing.   Cardiovascular:  Negative for chest pain and leg swelling.  Gastrointestinal:  Negative for abdominal pain, nausea and vomiting.  Musculoskeletal:  Negative for arthralgias and myalgias.  Skin:  Negative for color change and rash.  All other systems reviewed and are negative.  Physical Exam Updated Vital Signs BP (!) 135/96 (BP Location: Right Arm)   Pulse 77   Temp 98.1 F (36.7 C) (Oral)   Resp 16   SpO2 99%   Physical Exam Vitals and nursing note reviewed.  Constitutional:      General: He is not in acute distress.    Appearance: Normal appearance. He is well-developed and normal weight. He is not ill-appearing or diaphoretic.  HENT:     Head: Normocephalic and atraumatic.     Mouth/Throat:     Mouth: Mucous membranes are moist.     Pharynx: Oropharynx is clear.  Eyes:     General:        Right eye: No discharge.        Left eye: No discharge.  Cardiovascular:  Rate and Rhythm: Normal rate and regular rhythm.     Pulses: Normal pulses.     Heart sounds: Normal heart sounds.  Pulmonary:     Effort: Pulmonary effort is normal. No respiratory distress.     Breath sounds: Wheezing present.     Comments: Respirations equal and unlabored, patient able to speak in full sentences, satting well on room air, does have some faint scattered expiratory wheezing noted bilaterally Musculoskeletal:     Cervical back: Neck supple.     Right lower leg: No edema.     Left lower leg: No edema.  Neurological:     Mental Status: He is alert and oriented to person, place, and time.     Coordination: Coordination normal.  Psychiatric:        Mood and Affect: Mood normal.        Behavior: Behavior normal.    ED  Results / Procedures / Treatments   Labs (all labs ordered are listed, but only abnormal results are displayed) Labs Reviewed  RESP PANEL BY RT-PCR (FLU A&B, COVID) ARPGX2    EKG EKG Interpretation  Date/Time:  Tuesday November 03 2020 04:36:21 EDT Ventricular Rate:  94 PR Interval:  134 QRS Duration: 100 QT Interval:  336 QTC Calculation: 420 R Axis:   88 Text Interpretation: Normal sinus rhythm Septal infarct , age undetermined Abnormal ECG t waves more prominent than 2015 Confirmed by Marily Memos [phone]) on 11/03/2020 4:45:09 AM  Radiology DG Chest 2 View  Result Date: 11/03/2020 CLINICAL DATA:  Dyspnea, wheezing, asthma EXAM: CHEST - 2 VIEW COMPARISON:  Radiograph 06/08/2020 FINDINGS: Chronic hyperinflation with some mild airways thickening which could reflect acute or chronic reactive airways disease. No focal consolidative opacity. No pneumothorax or effusion. The cardiomediastinal contours are unremarkable. No acute osseous or soft tissue abnormality. IMPRESSION: Chronic hyperinflation. Mild airways thickening could reflect acute or chronic reactive airways disease or bronchitis. No other acute cardiopulmonary abnormality. Electronically Signed   By: Kreg Shropshire M.D.   On: 11/03/2020 04:52    Procedures Procedures   Medications Ordered in ED Medications  albuterol (VENTOLIN HFA) 108 (90 Base) MCG/ACT inhaler 4 puff (4 puffs Inhalation Given 11/03/20 0443)  AeroChamber Plus Flo-Vu Large MISC 1 each (1 each Other Given 11/03/20 1318)  ipratropium-albuterol (DUONEB) 0.5-2.5 (3) MG/3ML nebulizer solution 3 mL (3 mLs Nebulization Given 11/03/20 1320)  predniSONE (DELTASONE) tablet 60 mg (60 mg Oral Given 11/03/20 1318)    ED Course  I have reviewed the triage vital signs and the nursing notes.  Pertinent labs & imaging results that were available during my care of the patient were reviewed by me and considered in my medical decision making (see chart for details).    MDM  Rules/Calculators/A&P                         Patient presents with wheezing and shortness of breath consistent with prior asthma exacerbations.  Wheezing resolved with breathing treatment.  Chest x-ray with chronic reactive airway disease.  No other acute cardiopulmonary disease noted COVID test negative.  Will treat with bronchodilators and prednisone.  Stressed return precautions and follow-up.  Discharged home in good condition.  Final Clinical Impression(s) / ED Diagnoses Final diagnoses:  Exacerbation of asthma, unspecified asthma severity, unspecified whether persistent    Rx / DC Orders ED Discharge Orders          Ordered    predniSONE (DELTASONE) 20 MG tablet  Daily        11/03/20 1354             Dartha Lodge, PA-C 11/03/20 1420    Gerhard Munch, MD 11/04/20 [phone]

## 2021-02-26 ENCOUNTER — Emergency Department (HOSPITAL_BASED_OUTPATIENT_CLINIC_OR_DEPARTMENT_OTHER)
Admission: EM | Admit: 2021-02-26 | Discharge: 2021-02-26 | Disposition: A | Discharge status: Home / Self Care | Payer: No Typology Code available for payment source | Attending: Emergency Medicine | Admitting: Emergency Medicine

## 2021-02-26 ENCOUNTER — Encounter (HOSPITAL_BASED_OUTPATIENT_CLINIC_OR_DEPARTMENT_OTHER): Payer: Self-pay | Admitting: *Deleted

## 2021-02-26 ENCOUNTER — Other Ambulatory Visit: Payer: Self-pay

## 2021-02-26 DIAGNOSIS — F17210 Nicotine dependence, cigarettes, uncomplicated: Secondary | ICD-10-CM | POA: Diagnosis not present

## 2021-02-26 DIAGNOSIS — U071 COVID-19: Secondary | ICD-10-CM | POA: Insufficient documentation

## 2021-02-26 DIAGNOSIS — R000 Tachycardia, unspecified: Secondary | ICD-10-CM | POA: Diagnosis not present

## 2021-02-26 DIAGNOSIS — J45909 Unspecified asthma, uncomplicated: Secondary | ICD-10-CM | POA: Insufficient documentation

## 2021-02-26 DIAGNOSIS — R509 Fever, unspecified: Secondary | ICD-10-CM | POA: Diagnosis present

## 2021-02-26 LAB — RESP PANEL BY RT-PCR (FLU A&B, COVID) ARPGX2
Influenza A by PCR: NEGATIVE
Influenza B by PCR: NEGATIVE
SARS Coronavirus 2 by RT PCR: POSITIVE — AB

## 2021-02-26 MED ORDER — ACETAMINOPHEN 325 MG PO TABS
650.0000 mg | ORAL_TABLET | Freq: Once | ORAL | Status: AC
  Administered 2021-02-26: 650 mg via ORAL
  Filled 2021-02-26: qty 2

## 2021-02-26 MED ORDER — NIRMATRELVIR/RITONAVIR (PAXLOVID)TABLET: 3.0000 | ORAL_TABLET | Freq: Two times a day (BID) | ORAL | 0 refills | Status: AC

## 2021-02-26 NOTE — ED Provider Notes (Signed)
[facility] EMERGENCY DEPARTMENT Provider Note   CSN: [id number] Arrival date & time: 02/26/21  2145     History Chief Complaint  Patient presents with  . Fever    Kirk Allen is a 40 y.o. male.  The history is provided by the patient.  Fever Associated symptoms: headaches   URI Presenting symptoms: fever   Severity:  Moderate Onset quality:  Gradual Duration:  24 hours Timing:  Constant Progression:  Unchanged Chronicity:  New Relieved by:  None tried Worsened by:  Nothing Ineffective treatments:  None tried Associated symptoms: arthralgias, headaches and wheezing   Associated symptoms comment:  Sore throat.  No abd pain, n/v/d.  No visual changes or neck pain.  Unknown if he has had sick contact.  Never received covid or flu shots Risk factors: no recent illness and no recent travel   Risk factors comment:  Asthma and tobacco user     Past Medical History:  Diagnosis Date  . Asthma     There are no problems to display for this patient.   History reviewed. No pertinent surgical history.     No family history on file.  Social History   Tobacco Use  . Smoking status: Every Day    Packs/day: 0.50    Types: Cigarettes  . Smokeless tobacco: Never  Vaping Use  . Vaping Use: Never used  Substance Use Topics  . Alcohol use: Yes    Comment: social  . Drug use: No    Home Medications Prior to Admission medications   Medication Sig Start Date End Date Taking? Authorizing Provider  benzonatate (TESSALON) 100 MG capsule Take 1 capsule (100 mg total) by mouth every 8 (eight) hours. 04/24/14   Loren Racer, MD  loratadine (CLARITIN) 10 MG tablet Take 1 tablet (10 mg total) by mouth daily. 04/24/14   Loren Racer, MD    Allergies    Patient has no known allergies.  Review of Systems   Review of Systems  Constitutional:  Positive for fever.  Respiratory:  Positive for wheezing.   Musculoskeletal:  Positive for arthralgias.   Neurological:  Positive for headaches.  All other systems reviewed and are negative.  Physical Exam Updated Vital Signs BP 96/63   Pulse (!) 106   Temp (!) 101.5 F (38.6 C) (Oral)   Resp 16   Ht 6\' 3"  (1.905 m)   Wt 83.9 kg   SpO2 95%   BMI 23.12 kg/m   Physical Exam Vitals and nursing note reviewed.  Constitutional:      General: He is not in acute distress.    Appearance: He is well-developed.  HENT:     Head: Normocephalic and atraumatic.     Right Ear: Tympanic membrane normal.     Left Ear: Tympanic membrane normal.     Mouth/Throat:     Mouth: Mucous membranes are moist.     Pharynx: Uvula midline. Posterior oropharyngeal erythema present. No oropharyngeal exudate.  Eyes:     Conjunctiva/sclera: Conjunctivae normal.     Pupils: Pupils are equal, round, and reactive to light.  Neck:     Meningeal: Brudzinski's sign and Kernig's sign absent.  Cardiovascular:     Rate and Rhythm: Regular rhythm. Tachycardia present.     Heart sounds: No murmur heard. Pulmonary:     Effort: Pulmonary effort is normal. No respiratory distress.     Breath sounds: Normal breath sounds. No wheezing or rales.  Abdominal:     General:  There is no distension.     Palpations: Abdomen is soft.     Tenderness: There is no abdominal tenderness. There is no guarding or rebound.  Musculoskeletal:        General: No tenderness. Normal range of motion.     Cervical back: Normal range of motion and neck supple. No tenderness.     Right lower leg: No edema.     Left lower leg: No edema.  Lymphadenopathy:     Cervical: No cervical adenopathy.  Skin:    General: Skin is warm and dry.     Findings: No erythema or rash.  Neurological:     Mental Status: He is alert and oriented to person, place, and time. Mental status is at baseline.  Psychiatric:        Mood and Affect: Mood normal.        Behavior: Behavior normal.    ED Results / Procedures / Treatments   Labs (all labs ordered are  listed, but only abnormal results are displayed) Labs Reviewed  RESP PANEL BY RT-PCR (FLU A&B, COVID) ARPGX2    EKG None  Radiology No results found.  Procedures Procedures   Medications Ordered in ED Medications  acetaminophen (TYLENOL) tablet 650 mg (650 mg Oral Given 02/26/21 2158)    ED Course  I have reviewed the triage vital signs and the nursing notes.  Pertinent labs & imaging results that were available during my care of the patient were reviewed by me and considered in my medical decision making (see chart for details).    MDM Rules/Calculators/A&P                           Pt with symptoms consistent with viral URI vs COVID vs flu.  Well appearing here.  No signs of breathing difficulty  No signs of pharyngitis, otitis or abnormal abdominal findings.   Swab pending.  Pt given fever control and oral fluids.  Final Clinical Impression(s) / ED Diagnoses Final diagnoses:  None    Rx / DC Orders ED Discharge Orders     None        Gwyneth Sprout, MD 02/26/21 2322

## 2021-02-26 NOTE — ED Triage Notes (Signed)
C/o h/a , fever, body aches, sore throat x 2 days

## 2021-02-26 NOTE — ED Provider Notes (Signed)
Nursing notes and vitals signs, including pulse oximetry, reviewed.  Summary of this visit's results, reviewed by myself:  EKG:  EKG Interpretation  Date/Time:    Ventricular Rate:    PR Interval:    QRS Duration:   QT Interval:    QTC Calculation:   R Axis:     Text Interpretation:          Labs:  Results for orders placed or performed during the hospital encounter of 02/26/21 (from the past 24 hour(s))  Resp Panel by RT-PCR (Flu A&B, Covid) Nasopharyngeal Swab     Status: Abnormal   Collection Time: 02/26/21 10:46 PM   Specimen: Nasopharyngeal Swab; Nasopharyngeal(NP) swabs in vial transport medium  Result Value Ref Range   SARS Coronavirus 2 by RT PCR POSITIVE (A) NEGATIVE   Influenza A by PCR NEGATIVE NEGATIVE   Influenza B by PCR NEGATIVE NEGATIVE    Imaging Studies: No results found.  Patient positive for COVID.  He is in favor of starting an antiviral for this as he is only been sick for 1 day and does have underlying lung disease.  We will start him on Paxlovid.    Kirk Allen, Kirk Ruiz, MD 02/26/21 2340

## 2023-11-30 ENCOUNTER — Ambulatory Visit (INDEPENDENT_AMBULATORY_CARE_PROVIDER_SITE_OTHER): Payer: Self-pay | Admitting: Family Medicine

## 2023-11-30 ENCOUNTER — Encounter: Payer: Self-pay | Admitting: Family Medicine

## 2023-11-30 VITALS — BP 108/83 | HR 76 | Ht 75.0 in | Wt 187.0 lb

## 2023-11-30 DIAGNOSIS — Z800 Family history of malignant neoplasm of digestive organs: Secondary | ICD-10-CM

## 2023-11-30 DIAGNOSIS — J4540 Moderate persistent asthma, uncomplicated: Secondary | ICD-10-CM | POA: Insufficient documentation

## 2023-11-30 DIAGNOSIS — Z1211 Encounter for screening for malignant neoplasm of colon: Secondary | ICD-10-CM

## 2023-11-30 DIAGNOSIS — Z0000 Encounter for general adult medical examination without abnormal findings: Secondary | ICD-10-CM

## 2023-11-30 DIAGNOSIS — Z9109 Other allergy status, other than to drugs and biological substances: Secondary | ICD-10-CM | POA: Diagnosis not present

## 2023-11-30 MED ORDER — ALBUTEROL SULFATE HFA 108 (90 BASE) MCG/ACT IN AERS: 2.0000 | INHALATION_SPRAY | Freq: Four times a day (QID) | RESPIRATORY_TRACT | 0 refills | Status: DC | PRN

## 2023-11-30 MED ORDER — FLUTICASONE-SALMETEROL 100-50 MCG/ACT IN AEPB: 1.0000 | INHALATION_SPRAY | Freq: Two times a day (BID) | RESPIRATORY_TRACT | 3 refills | Status: AC

## 2023-11-30 MED ORDER — CETIRIZINE HCL 10 MG PO TABS: 10.0000 mg | ORAL_TABLET | Freq: Every day | ORAL | 11 refills | Status: AC

## 2023-11-30 NOTE — Assessment & Plan Note (Signed)
 Asthma questionable control - using albuterol  twice a day with/without symptoms. Discussed benefits of maintenance inhaler to reduce albuterol  use and improve control. Agreed to try maintenance inhaler and consider specialist evaluation. - Prescribe Wixela inhaler twice daily. - Refill albuterol  inhaler for rescue use. - Refer to allergy/asthma specialist for further evaluation and possible allergy testing. - Prescribe Zyrtec  for allergy symptoms. - Smoking cessation encouraged

## 2023-11-30 NOTE — Patient Instructions (Signed)

## 2023-11-30 NOTE — Assessment & Plan Note (Signed)
 Maternal grandfather had colon cancer. Early screening recommended due to family history. Patient interested in colonoscopy despite age. - Refer to gastroenterologist for colonoscopy.

## 2023-11-30 NOTE — Progress Notes (Signed)
 New Patient Office Visit  Subjective    Patient ID: Kirk Allen, male    DOB: 1980-09-27  Age: 43 y.o. MRN: [id number]  CC:  Chief Complaint  Patient presents with   Establish Care    HPI Kirk Allen presents to establish care. He lives with roommates. He is a Location manager.    Discussed the use of AI scribe software for clinical note transcription with the patient, who gave verbal consent to proceed.  History of Present Illness Kirk Allen is a 43 year old male who presents for asthma management and evaluation of family history of colon cancer.  He has a history of asthma, which is generally well-controlled with the use of albuterol  as needed. His asthma is triggered by physical activity, heat, certain smells like heavy colognes, perfumes, and outdoor allergens such as fresh cut grass. He uses albuterol  'a pop or two a day' and reports that he smokes cigarettes. He has not been on a maintenance inhaler before.  He smokes about two to three packs of cigarettes per week and has been smoking for over twenty years. He does not consume alcohol or use drugs.  There is a significant family history of cancer. His mother had breast cancer, and his maternal grandfather passed away from colon cancer - diagnosed in his late forties. His paternal grandfather and uncle had lung cancer, both of whom were heavy smokers.  No current symptoms related to colon cancer, such as changes in bowel habits or unexplained weight loss. He lives with roommates and works as a Location manager. He has not had any recent blood work or abnormal results that he is aware of.        Outpatient Encounter Medications as of 11/30/2023  Medication Sig   albuterol  (ACCUNEB ) 0.63 MG/3ML nebulizer solution Take 1 ampule by nebulization every 6 (six) hours as needed for wheezing.   albuterol  (VENTOLIN  HFA) 108 (90 Base) MCG/ACT inhaler Inhale 2 puffs into the lungs every 6 (six) hours as needed for wheezing  or shortness of breath.   cetirizine  (ZYRTEC ) 10 MG tablet Take 1 tablet (10 mg total) by mouth daily.   fluticasone -salmeterol (WIXELA INHUB) 100-50 MCG/ACT AEPB Inhale 1 puff into the lungs 2 (two) times daily.   No facility-administered encounter medications on file as of 11/30/2023.    Past Medical History:  Diagnosis Date   Asthma     History reviewed. No pertinent surgical history.  Family History  Problem Relation Age of Onset   Breast cancer Mother    Cancer Maternal Grandfather 51       colon   Cancer Paternal Grandfather        lung   Cancer Maternal Uncle        lung    Social History   Socioeconomic History   Marital status: Single    Spouse name: Not on file   Number of children: Not on file   Years of education: Not on file   Highest education level: Not on file  Occupational History   Not on file  Tobacco Use   Smoking status: Every Day    Current packs/day: 0.50    Types: Cigarettes   Smokeless tobacco: Never  Vaping Use   Vaping status: Never Used  Substance and Sexual Activity   Alcohol use: Not Currently    Comment: 2 yrs sober   Drug use: No   Sexual activity: Not on file  Other Topics Concern  Not on file  Social History Narrative   Not on file   Social Drivers of Health   Financial Resource Strain: Not on file  Food Insecurity: Not on file  Transportation Needs: Not on file  Physical Activity: Not on file  Stress: Not on file  Social Connections: Not on file  Intimate Partner Violence: Not on file    ROS All review of systems negative except what is listed in the HPI     Objective    BP 108/83   Pulse 76   Ht 6' 3 (1.905 m)   Wt 187 lb (84.8 kg)   SpO2 99%   BMI 23.37 kg/m   Physical Exam Vitals reviewed.  Constitutional:      Appearance: Normal appearance.  Cardiovascular:     Rate and Rhythm: Normal rate and regular rhythm.     Heart sounds: Normal heart sounds.  Pulmonary:     Effort: Pulmonary effort is  normal.     Breath sounds: Normal breath sounds.  Skin:    General: Skin is warm and dry.  Neurological:     Mental Status: He is alert and oriented to person, place, and time.  Psychiatric:        Mood and Affect: Mood normal.        Behavior: Behavior normal.        Thought Content: Thought content normal.        Judgment: Judgment normal.         Assessment & Plan:   Problem List Items Addressed This Visit       Active Problems   Moderate persistent asthma - Primary   Asthma questionable control - using albuterol  twice a day with/without symptoms. Discussed benefits of maintenance inhaler to reduce albuterol  use and improve control. Agreed to try maintenance inhaler and consider specialist evaluation. - Prescribe Wixela inhaler twice daily. - Refill albuterol  inhaler for rescue use. - Refer to allergy/asthma specialist for further evaluation and possible allergy testing. - Prescribe Zyrtec  for allergy symptoms. - Smoking cessation encouraged      Relevant Medications   albuterol  (ACCUNEB ) 0.63 MG/3ML nebulizer solution   fluticasone -salmeterol (WIXELA INHUB) 100-50 MCG/ACT AEPB   albuterol  (VENTOLIN  HFA) 108 (90 Base) MCG/ACT inhaler   Other Relevant Orders   Ambulatory referral to Allergy   Family history of colon cancer   Maternal grandfather had colon cancer. Early screening recommended due to family history. Patient interested in colonoscopy despite age. - Refer to gastroenterologist for colonoscopy.      Relevant Orders   Ambulatory referral to Gastroenterology   Other Visit Diagnoses       Encounter for medical examination to establish care         Environmental allergies       Relevant Medications   cetirizine  (ZYRTEC ) 10 MG tablet   Other Relevant Orders   Ambulatory referral to Allergy     Screen for colon cancer       Relevant Orders   Ambulatory referral to Gastroenterology          Return for physical/labs at your convenience .   Waddell KATHEE Mon, NP

## 2023-12-01 ENCOUNTER — Telehealth: Payer: Self-pay

## 2023-12-01 NOTE — Telephone Encounter (Signed)
 Initial Comment Caller states he is trying to pick up his remaining (2) prescriptions. The pharmacy does not have Pt's Rx for an inhaler and his allergy medication. Pt was only able to pick-up his inhaler for Albuterol . Pt's provider is Norman Mon. Pt is not experiencing any current Sx. Translation No Nurse Assessment Nurse: Marius, RN, Melissa Date/Time (Eastern Time): 11/30/2023 7:49:24 PM You have opened med assessment, do you wish to continue? ---Yes Please select the assessment type ---RX called in but not at pharm Additional Documentation ---allergy and inhaler med, does not know name of med Has the office closed within the last 30 minutes? ---No Does the client directives allow for assistance with medications after hours? ---No Nurse: Marius, RN, Melissa Date/Time (Eastern Time): 11/30/2023 7:48:39 PM Confirm and document reason for call. If symptomatic, describe symptoms. ---caller states pharmacy does not have RX for allergy and inhaler meds, denies new or worsening sx, instructed cannot call about meds when office is closed, call office tomorrow, can call back for new or worsening sx, verbalized understanding Does the patient have any new or worsening symptoms? ---No Disp. Time Titus Time) Disposition Final User 11/30/2023 7:53:36 PM Send To Nurse Acey Ma, RN, Rhonda 11/30/2023 8:44:34 PM Attempt made - no message left Glendale Ada, RN, Ross PLEASE NOTE: All timestamps contained within this report are represented as [place] Standard Time. CONFIDENTIALTY NOTICE: This fax transmission is intended only for the addressee. It contains information that is legally privileged, confidential or otherwise protected from use or disclosure. If you are not the intended recipient, you are strictly prohibited from reviewing, disclosing, copying using or disseminating any of this information or taking any action in reliance on or regarding this information. If you have received  this fax in error, please notify us  immediately by telephone so that we can arrange for its return to us . Phone: [phone], Toll-Free: [phone], Fax: [phone] [email or web address] 1981/05/02 Page: 1 of2 CallId: [id number] Disp. Time Titus Time) Disposition Final User 11/30/2023 8:52:24 PM Clinical Call Yes Marius, RN, Eleanor Final Disposition 11/30/2023 8:52:24 PM Clinical Call Yes Camery, RN, Eleanor Caller Disagree/Comply Comply Caller Understands Yes PreDisposition Call Doctor Comments User: Ross Glendale Ada, RN Date/Time Titus Time): 11/30/2023 8:43:00 PM Each time someone answers they hang up on RN Referrals REFERRED TO PCP OFFICE

## 2023-12-01 NOTE — Telephone Encounter (Signed)
 Called [facility] and verified they do have prescriptions and they are ready for pick up. Patient has two profiles. Called and LVM letting patient know. To call with further questions.

## 2023-12-01 NOTE — Telephone Encounter (Signed)
 Copied from CRM [id number]. Topic: Clinical - Prescription Issue >> Dec 01, 2023  9:46 AM Charolett L wrote: Reason for CRM: patient stated that the office never sent these medications cetirizine  (ZYRTEC ) 10 MG tablet fluticasone -salmeterol (WIXELA INHUB) 100-50 MCG/ACT AEPB To the pharmacy and patient would like a  follow up as well

## 2023-12-01 NOTE — Telephone Encounter (Signed)
 Medications were sent to [facility] [address]. Will discuss if he calls office today as instructed.

## 2023-12-13 ENCOUNTER — Encounter: Admitting: Family Medicine

## 2023-12-14 ENCOUNTER — Ambulatory Visit (INDEPENDENT_AMBULATORY_CARE_PROVIDER_SITE_OTHER): Admitting: Family Medicine

## 2023-12-14 ENCOUNTER — Other Ambulatory Visit (HOSPITAL_COMMUNITY)
Admission: RE | Admit: 2023-12-14 | Discharge: 2023-12-14 | Disposition: A | Discharge status: Home / Self Care | Source: Ambulatory Visit | Attending: Family Medicine | Admitting: Family Medicine

## 2023-12-14 ENCOUNTER — Encounter: Payer: Self-pay | Admitting: Family Medicine

## 2023-12-14 VITALS — BP 126/89 | HR 86 | Ht 75.0 in | Wt 188.0 lb

## 2023-12-14 DIAGNOSIS — Z113 Encounter for screening for infections with a predominantly sexual mode of transmission: Secondary | ICD-10-CM | POA: Insufficient documentation

## 2023-12-14 DIAGNOSIS — Z1159 Encounter for screening for other viral diseases: Secondary | ICD-10-CM

## 2023-12-14 DIAGNOSIS — Z114 Encounter for screening for human immunodeficiency virus [HIV]: Secondary | ICD-10-CM

## 2023-12-14 DIAGNOSIS — Z0000 Encounter for general adult medical examination without abnormal findings: Secondary | ICD-10-CM

## 2023-12-14 LAB — COMPREHENSIVE METABOLIC PANEL WITH GFR
ALT: 17 U/L (ref 0–53)
AST: 14 U/L (ref 0–37)
Albumin: 4.4 g/dL (ref 3.5–5.2)
Alkaline Phosphatase: 51 U/L (ref 39–117)
BUN: 11 mg/dL (ref 6–23)
CO2: 31 meq/L (ref 19–32)
Calcium: 9.7 mg/dL (ref 8.4–10.5)
Chloride: 104 meq/L (ref 96–112)
Creatinine, Ser: 1.01 mg/dL (ref 0.40–1.50)
GFR: 90.39 mL/min (ref >60.00)
Glucose, Bld: 88 mg/dL (ref 70–99)
Potassium: 4.9 meq/L (ref 3.5–5.1)
Sodium: 140 meq/L (ref 135–145)
Total Bilirubin: 0.8 mg/dL (ref 0.2–1.2)
Total Protein: 7.3 g/dL (ref 6.0–8.3)

## 2023-12-14 LAB — LIPID PANEL
Cholesterol: 149 mg/dL (ref 0–200)
HDL: 46.4 mg/dL (ref >39.00)
LDL Cholesterol: 86 mg/dL (ref 0–99)
NonHDL: 102.83
Total CHOL/HDL Ratio: 3
Triglycerides: 86 mg/dL (ref 0.0–149.0)
VLDL: 17.2 mg/dL (ref 0.0–40.0)

## 2023-12-14 LAB — CBC WITH DIFFERENTIAL/PLATELET
Basophils Absolute: 0.1 K/uL (ref 0.0–0.1)
Basophils Relative: 1.5 % (ref 0.0–3.0)
Eosinophils Absolute: 0.3 K/uL (ref 0.0–0.7)
Eosinophils Relative: 5.9 % — ABNORMAL HIGH (ref 0.0–5.0)
HCT: 45.7 % (ref 39.0–52.0)
Hemoglobin: 15.1 g/dL (ref 13.0–17.0)
Lymphocytes Relative: 36 % (ref 12.0–46.0)
Lymphs Abs: 1.8 K/uL (ref 0.7–4.0)
MCHC: 33.1 g/dL (ref 30.0–36.0)
MCV: 94.3 fl (ref 78.0–100.0)
Monocytes Absolute: 0.4 K/uL (ref 0.1–1.0)
Monocytes Relative: 8.7 % (ref 3.0–12.0)
Neutro Abs: 2.4 K/uL (ref 1.4–7.7)
Neutrophils Relative %: 47.9 % (ref 43.0–77.0)
Platelets: 216 K/uL (ref 150.0–400.0)
RBC: 4.84 Mil/uL (ref 4.22–5.81)
RDW: 13.6 % (ref 11.5–15.5)
WBC: 5 K/uL (ref 4.0–10.5)

## 2023-12-14 LAB — TSH: TSH: 0.94 u[IU]/mL (ref 0.35–5.50)

## 2023-12-14 NOTE — Progress Notes (Signed)
 Complete physical exam  Patient: Kirk Allen   DOB: 22-Dec-1980   43 y.o. Male  MRN: [id number]  Subjective:    Chief Complaint  Patient presents with   Annual Exam    Kirk Allen is a 43 y.o. male who presents today for a complete physical exam. He reports consuming a general diet. The patient does not participate in regular exercise at present. He generally feels well. He reports sleeping well. He does not have additional problems to discuss today.   Currently lives with: roommates Acute concerns or interim problems since last visit: no  Vision concerns: no Dental concerns: no STD concerns: no  ETOH use: no Nicotine use: cigarettes  Recreational drugs/illegal substances: no        Most recent fall risk assessment:    11/30/2023    2:09 PM  Fall Risk   Falls in the past year? 0  Number falls in past yr: 0  Injury with Fall? 0  Risk for fall due to : No Fall Risks  Follow up Falls evaluation completed     Most recent depression screenings:    11/30/2023    2:09 PM  PHQ 2/9 Scores  PHQ - 2 Score 0  PHQ- 9 Score 1            Patient Care Team: Almarie Waddell NOVAK, NP as PCP - General (Family Medicine)   Outpatient Medications Prior to Visit  Medication Sig   albuterol  (VENTOLIN  HFA) 108 (90 Base) MCG/ACT inhaler Inhale 2 puffs into the lungs every 6 (six) hours as needed for wheezing or shortness of breath.   cetirizine  (ZYRTEC ) 10 MG tablet Take 1 tablet (10 mg total) by mouth daily.   fluticasone -salmeterol (WIXELA INHUB) 100-50 MCG/ACT AEPB Inhale 1 puff into the lungs 2 (two) times daily.   No facility-administered medications prior to visit.    ROS All review of systems negative except what is listed in the HPI        Objective:     BP 126/89   Pulse 86   Ht 6' 3 (1.905 m)   Wt 188 lb (85.3 kg)   SpO2 99%   BMI 23.50 kg/m    Physical Exam Vitals reviewed.  Constitutional:      General: He is not in acute distress.     Appearance: Normal appearance. He is not ill-appearing.  HENT:     Head: Normocephalic and atraumatic.     Right Ear: Tympanic membrane normal.     Left Ear: Tympanic membrane normal.     Nose: Nose normal.     Mouth/Throat:     Mouth: Mucous membranes are moist.     Pharynx: Oropharynx is clear.  Eyes:     Extraocular Movements: Extraocular movements intact.     Conjunctiva/sclera: Conjunctivae normal.     Pupils: Pupils are equal, round, and reactive to light.  Cardiovascular:     Rate and Rhythm: Normal rate and regular rhythm.     Pulses: Normal pulses.     Heart sounds: Normal heart sounds.  Pulmonary:     Effort: Pulmonary effort is normal.     Breath sounds: Normal breath sounds.  Abdominal:     General: Abdomen is flat. Bowel sounds are normal. There is no distension.     Palpations: Abdomen is soft. There is no mass.     Tenderness: There is no abdominal tenderness. There is no right CVA tenderness, left CVA tenderness, guarding or rebound.  Genitourinary:    Comments: Deferred exam Musculoskeletal:        General: Normal range of motion.     Cervical back: Normal range of motion and neck supple. No tenderness.     Right lower leg: No edema.     Left lower leg: No edema.  Lymphadenopathy:     Cervical: No cervical adenopathy.  Skin:    General: Skin is warm and dry.     Capillary Refill: Capillary refill takes less than 2 seconds.  Neurological:     General: No focal deficit present.     Mental Status: He is alert and oriented to person, place, and time. Mental status is at baseline.  Psychiatric:        Mood and Affect: Mood normal.        Behavior: Behavior normal.        Thought Content: Thought content normal.        Judgment: Judgment normal.         No results found for any visits on 12/14/23.     Assessment & Plan:    Routine Health Maintenance and Physical Exam Discussed health promotion and safety including diet and exercise recommendations,  dental health, and injury prevention. Tobacco cessation if applicable. Seat belts, sunscreen, smoke detectors, etc.     There is no immunization history on file for this patient.  Health Maintenance  Topic Date Due   HIV Screening  Never done   Hepatitis C Screening  Never done   Pneumococcal Vaccine 85-47 Years old (1 of 2 - PCV) 11/28/2024 (Originally 05/09/1998)   Hepatitis B Vaccines (1 of 3 - 19+ 3-dose series) 11/28/2024 (Originally 05/09/1998)   HPV VACCINES (1 - 3-dose SCDM series) 11/28/2024 (Originally 05/09/2006)   COVID-19 Vaccine (1 - 2024-25 season) 11/28/2024 (Originally 01/22/2023)   DTaP/Tdap/Td (1 - Tdap) 12/13/2024 (Originally 05/09/1998)   INFLUENZA VACCINE  12/22/2023   Meningococcal B Vaccine  Aged Out        Problem List Items Addressed This Visit   None Visit Diagnoses       Annual physical exam    -  Primary   Relevant Orders   CBC with Differential/Platelet   Comprehensive metabolic panel with GFR   Lipid panel   TSH     Screen for STD (sexually transmitted disease)       Relevant Orders   RPR   HIV Antibody (routine testing w rflx)   HSV(herpes simplex vrs) 1+2 ab-IgG   Urine cytology ancillary only   Hepatitis C antibody     Encounter for screening for HIV       Relevant Orders   HIV Antibody (routine testing w rflx)     Encounter for hepatitis C screening test for low risk patient       Relevant Orders   Hepatitis C antibody          PATIENT COUNSELING:     Encouraged smoking cessation.   Recommend that most people either abstain from alcohol or drink within safe limits (<=14/week and <=4 drinks/occasion for males, <=7/weeks and <= 3 drinks/occasion for females) and that the risk for alcohol disorders and other health effects rises proportionally with the number of drinks per week and how often a drinker exceeds daily limits.   Diet: Recommend to adjust caloric intake to maintain or achieve ideal body weight, to reduce intake of  dietary saturated fat and total fat, to limit sodium intake by avoiding high sodium foods and  not adding table salt, and to maintain adequate dietary potassium and calcium preferably from fresh fruits, vegetables, and low-fat dairy products.   Emphasized the importance of regular exercise.  Injury prevention: Recommend seatbelts, safety helmets, smoke detector, etc..   Dental health: Recommend regular tooth brushing, flossing, and dental visits.       Return in about 1 year (around 12/13/2024) for physical.     Waddell KATHEE Mon, NP

## 2023-12-15 LAB — URINE CYTOLOGY ANCILLARY ONLY
Chlamydia: NEGATIVE
Comment: NEGATIVE
Comment: NEGATIVE
Comment: NORMAL
Neisseria Gonorrhea: NEGATIVE
Trichomonas: NEGATIVE

## 2023-12-15 LAB — HSV(HERPES SIMPLEX VRS) I + II AB-IGG
HSV 1 IGG,TYPE SPECIFIC AB: <0.9 {index}
HSV 2 IGG,TYPE SPECIFIC AB: <0.9 {index}

## 2023-12-15 LAB — HIV ANTIBODY (ROUTINE TESTING W REFLEX): HIV 1&2 Ab, 4th Generation: NONREACTIVE

## 2023-12-15 LAB — HEPATITIS C ANTIBODY: Hepatitis C Ab: NONREACTIVE

## 2023-12-15 LAB — RPR: RPR Ser Ql: NONREACTIVE

## 2023-12-18 ENCOUNTER — Ambulatory Visit: Payer: Self-pay | Admitting: Family Medicine

## 2023-12-27 ENCOUNTER — Other Ambulatory Visit: Payer: Self-pay | Admitting: Family Medicine

## 2023-12-27 DIAGNOSIS — J4540 Moderate persistent asthma, uncomplicated: Secondary | ICD-10-CM

## 2024-03-19 ENCOUNTER — Telehealth: Payer: Self-pay | Admitting: Family Medicine

## 2024-03-19 DIAGNOSIS — Z800 Family history of malignant neoplasm of digestive organs: Secondary | ICD-10-CM

## 2024-03-19 DIAGNOSIS — Z1211 Encounter for screening for malignant neoplasm of colon: Secondary | ICD-10-CM

## 2024-03-19 NOTE — Telephone Encounter (Signed)
 Copied from CRM #[id number]. Topic: Referral - Request for Referral >> Mar 19, 2024  1:50 PM Leah C wrote: Did the patient discuss referral with their provider in the last year? Yes (If No - schedule appointment) (If Yes - send message)  Appointment offered? Yes, needs referral and patient has doctor that he would like to go to.   Type of order/referral and detailed reason for visit: Colonoscopy   Preference of office, provider, location: Dr. Rollin or Dr. Kristie Lamprey Nat Md, but patient prefers Dr. Rollin. [address], [place], [place] [address] [phone]  If referral order, have you been seen by this specialty before? No (If Yes, this issue or another issue? When? Where?  Can we respond through MyChart? Yes

## 2024-03-19 NOTE — Telephone Encounter (Signed)
 Previous referral sent to [facility]. New referral placed.

## 2024-03-20 NOTE — Telephone Encounter (Signed)
 Mychart message sent to patient.    Copied from CRM #[id number]. Topic: Referral - Status >> Mar 20, 2024  3:52 PM Kirk Allen wrote: Reason for CRM: Kirk Allen calling from [facility] to let Kirk Allen know that they are not in contract with the patient's insurance.

## 2024-04-03 ENCOUNTER — Telehealth: Payer: Self-pay

## 2024-04-03 NOTE — Telephone Encounter (Signed)
 Please send referral to alternate office.

## 2024-04-03 NOTE — Telephone Encounter (Signed)
 Copied from CRM [id number]. Topic: Referral - Status >> Apr 03, 2024 12:08 PM Shereese L wrote: Reason for CRM: Patient is calling to advised that he wants to be referred to Gustav ALONSO Mcgee, MD Specialties, Gastroenterology [facility] [address] [place] [address] [place], [place] [address] [phone]  He also stated that the [employer] is the primary insurance and is the one that should be used

## 2024-04-26 ENCOUNTER — Ambulatory Visit

## 2024-04-26 VITALS — Ht 75.0 in | Wt 190.0 lb

## 2024-04-26 DIAGNOSIS — Z1211 Encounter for screening for malignant neoplasm of colon: Secondary | ICD-10-CM

## 2024-04-26 DIAGNOSIS — Z800 Family history of malignant neoplasm of digestive organs: Secondary | ICD-10-CM

## 2024-04-26 MED ORDER — NA SULFATE-K SULFATE-MG SULF 17.5-3.13-1.6 GM/177ML PO SOLN: 1.0000 | Freq: Once | ORAL | 0 refills | Status: AC

## 2024-04-26 NOTE — Progress Notes (Signed)
 PCP MD at time of PV: Waddell Mon, NP __________________________________________________________________________________________________________________________________________  No egg allergy known to patient  No soy allergy known to patient No issues known to pt with past sedation with any surgeries or procedures Patient denies ever being told they had issues or difficulty with intubation  No FH of Malignant Hyperthermia Pt is not on diet pills Pt is not on  home 02  Pt is not on blood thinners  No A fib or A flutter Have any cardiac testing pending--no  LOA: independent  No Chew or Snuff tobacco __________________________________________________________________________________________________________________________________________  Constipation: no  Prep: suprep  __________________________________________________________________________________________________________________________________________  PV completed with patient. Prep instructions reviewed and provided during apt. Rx sent to preferred pharmacy.  __________________________________________________________________________________________________________________________________________  Patient's chart reviewed by Norleen Schillings CNRA prior to previsit and patient appropriate for the [facility].  Previsit completed and red dot placed by patient's name on their procedure day (on provider's schedule).

## 2024-05-02 ENCOUNTER — Encounter: Payer: Self-pay | Admitting: Pediatrics

## 2024-05-09 NOTE — Progress Notes (Unsigned)
 [facility] History and Physical   Primary Care Physician:  Almarie Waddell NOVAK, NP   Reason for Procedure:  Colorectal cancer screening  Plan:    Colonoscopy   The patient was provided an opportunity to ask questions and all were answered. The patient agreed with the plan.   HPI: Kirk Allen is a 43 y.o. male undergoing screening colonoscopy for colorectal cancer screening.  This is the patient's first colonoscopy.  Family history noteworthy for maternal grandfather having colorectal cancer.  No family history of colon polyps.  Patient denies current symptoms of rectal bleeding or change in bowel habits.   Past Medical History:  Diagnosis Date   Asthma     No past surgical history on file.  Prior to Admission medications  Medication Sig Start Date End Date Taking? Authorizing Provider  albuterol  (VENTOLIN  HFA) 108 (90 Base) MCG/ACT inhaler INHALE 2 PUFFS INTO THE LUNGS UP TO EVERY 6 HOURS AS NEEDED FOR WHEEZING OR SHORTNESS OF BREATH 12/27/23   Almarie Waddell NOVAK, NP  cetirizine  (ZYRTEC ) 10 MG tablet Take 1 tablet (10 mg total) by mouth daily. 11/30/23   Almarie Waddell NOVAK, NP  fluticasone -salmeterol (WIXELA INHUB) 100-50 MCG/ACT AEPB Inhale 1 puff into the lungs 2 (two) times daily. 11/30/23   Almarie Waddell NOVAK, NP    Current Outpatient Medications  Medication Sig Dispense Refill   albuterol  (VENTOLIN  HFA) 108 (90 Base) MCG/ACT inhaler INHALE 2 PUFFS INTO THE LUNGS UP TO EVERY 6 HOURS AS NEEDED FOR WHEEZING OR SHORTNESS OF BREATH 6.7 each 2   cetirizine  (ZYRTEC ) 10 MG tablet Take 1 tablet (10 mg total) by mouth daily. 30 tablet 11   fluticasone -salmeterol (WIXELA INHUB) 100-50 MCG/ACT AEPB Inhale 1 puff into the lungs 2 (two) times daily. 1 each 3   No current facility-administered medications for this visit.    Allergies as of 05/10/2024   (No Known Allergies)    Family History  Problem Relation Age of Onset   Breast cancer Mother    Cancer Maternal Uncle        lung    Colon cancer Maternal Grandfather    Cancer Maternal Grandfather 64       colon   Cancer Paternal Grandfather        lung   Rectal cancer Neg Hx    Stomach cancer Neg Hx     Social History   Socioeconomic History   Marital status: Single    Spouse name: Not on file   Number of children: Not on file   Years of education: Not on file   Highest education level: Not on file  Occupational History   Not on file  Tobacco Use   Smoking status: Every Day    Current packs/day: 0.50    Types: Cigarettes   Smokeless tobacco: Never  Vaping Use   Vaping status: Never Used  Substance and Sexual Activity   Alcohol use: Not Currently    Comment: 2 yrs sober   Drug use: No   Sexual activity: Not on file  Other Topics Concern   Not on file  Social History Narrative   Not on file   Social Drivers of Health   Tobacco Use: High Risk (04/26/2024)   Patient History    Smoking Tobacco Use: Every Day    Smokeless Tobacco Use: Never    Passive Exposure: Not on file  Financial Resource Strain: Not on file  Food Insecurity: Not on file  Transportation Needs: Not on file  Physical Activity: Not on file  Stress: Not on file  Social Connections: Not on file  Intimate Partner Violence: Not on file  Depression [phone]): Low Risk (11/30/2023)   Depression (PHQ2-9)    PHQ-2 Score: 1  Alcohol Screen: Not on file  Housing: Not on file  Utilities: Not on file  Health Literacy: Not on file    Review of Systems:  All other review of systems negative except as mentioned in the HPI.  Physical Exam: Vital signs There were no vitals taken for this visit.  General:   Alert,  Well-developed, well-nourished, pleasant and cooperative in NAD Airway:  Mallampati  Lungs:  Clear throughout to auscultation.   Heart:  Regular rate and rhythm; no murmurs, clicks, rubs,  or gallops. Abdomen:  Soft, nontender and nondistended. Normal bowel sounds.   Neuro/Psych:  Normal mood and affect. A and O x  3  Inocente Hausen, MD [facility]

## 2024-05-10 ENCOUNTER — Encounter: Payer: Self-pay | Admitting: Pediatrics

## 2024-05-10 ENCOUNTER — Ambulatory Visit (AMBULATORY_SURGERY_CENTER): Admitting: Pediatrics

## 2024-05-10 VITALS — BP 104/71 | HR 90 | Temp 97.8°F | Resp 20 | Ht 75.0 in | Wt 190.0 lb

## 2024-05-10 DIAGNOSIS — K644 Residual hemorrhoidal skin tags: Secondary | ICD-10-CM

## 2024-05-10 DIAGNOSIS — K5730 Diverticulosis of large intestine without perforation or abscess without bleeding: Secondary | ICD-10-CM | POA: Diagnosis not present

## 2024-05-10 DIAGNOSIS — Z800 Family history of malignant neoplasm of digestive organs: Secondary | ICD-10-CM

## 2024-05-10 DIAGNOSIS — D123 Benign neoplasm of transverse colon: Secondary | ICD-10-CM | POA: Diagnosis not present

## 2024-05-10 DIAGNOSIS — Z1211 Encounter for screening for malignant neoplasm of colon: Secondary | ICD-10-CM | POA: Diagnosis present

## 2024-05-10 DIAGNOSIS — D124 Benign neoplasm of descending colon: Secondary | ICD-10-CM

## 2024-05-10 DIAGNOSIS — K635 Polyp of colon: Secondary | ICD-10-CM

## 2024-05-10 DIAGNOSIS — K5140 Inflammatory polyps of colon without complications: Secondary | ICD-10-CM | POA: Diagnosis not present

## 2024-05-10 MED ORDER — SODIUM CHLORIDE 0.9 % IV SOLN: 500.0000 mL | INTRAVENOUS | Status: DC

## 2024-05-10 NOTE — Progress Notes (Unsigned)
 Awake and alert x 3, VSS, good SR's, pleased with anesthesia, report to RN

## 2024-05-10 NOTE — Patient Instructions (Addendum)
 Discharge patient to home (ambulatory).                           - Await pathology results.                           - Repeat colonoscopy for surveillance based on                            pathology results.                           - The findings and recommendations were discussed                            with the patient's family.                           - Patient has a contact number available for                            emergencies. The signs and symptoms of potential                            delayed complications were discussed with the                            patient. Return to normal activities tomorrow.                            Written discharge instructions were provided to the                            patient.  Handout on polyps given.      YOU HAD AN ENDOSCOPIC PROCEDURE TODAY AT THE [facility]:   Refer to the procedure report that was given to you for any specific questions about what was found during the examination.  If the procedure report does not answer your questions, please call your gastroenterologist to clarify.  If you requested that your care partner not be given the details of your procedure findings, then the procedure report has been included in a sealed envelope for you to review at your convenience later.  YOU SHOULD EXPECT: Some feelings of bloating in the abdomen. Passage of more gas than usual.  Walking can help get rid of the air that was put into your GI tract during the procedure and reduce the bloating. If you had a lower endoscopy (such as a colonoscopy or flexible sigmoidoscopy) you may notice spotting of blood in your stool or on the toilet paper. If you underwent a bowel prep for your procedure, you may not have a normal bowel movement for a few days.  Please Note:  You might notice some irritation and congestion in your nose or some drainage.  This is from the oxygen used during your procedure.  There is no need for  concern and it should clear up in a day or so.  SYMPTOMS TO REPORT IMMEDIATELY:  Following lower endoscopy (colonoscopy or flexible sigmoidoscopy):  Excessive amounts of blood in the stool  Significant tenderness or worsening of abdominal pains  Swelling of the abdomen that is new, acute  Fever of 100F or higher   For urgent or emergent issues, a gastroenterologist can be reached at any hour by calling [phone]. Do not use MyChart messaging for urgent concerns.    DIET:  We do recommend a small meal at first, but then you may proceed to your regular diet.  Drink plenty of fluids but you should avoid alcoholic beverages for 24 hours.  ACTIVITY:  You should plan to take it easy for the rest of today and you should NOT DRIVE or use heavy machinery until tomorrow (because of the sedation medicines used during the test).    FOLLOW UP: Our staff will call the number listed on your records the next business day following your procedure.  We will call around 7:15- 8:00 am to check on you and address any questions or concerns that you may have regarding the information given to you following your procedure. If we do not reach you, we will leave a message.     If any biopsies were taken you will be contacted by phone or by letter within the next 1-3 weeks.  Please call us  at [phone] if you have not heard about the biopsies in 3 weeks.    SIGNATURES/CONFIDENTIALITY: You and/or your care partner have signed paperwork which will be entered into your electronic medical record.  These signatures attest to the fact that that the information above on your After Visit Summary has been reviewed and is understood.  Full responsibility of the confidentiality of this discharge information lies with you and/or your care-partner.

## 2024-05-10 NOTE — Op Note (Addendum)
 [facility] Patient Name: Kirk Allen Procedure Date: 05/10/2024 12:54 PM MRN: [id number] Endoscopist: Inocente Hausen , MD, [id number] Age: 43 Referring MD:  Date of Birth: 07-23-1980 Gender: Male Account #: [id number] Procedure:                Colonoscopy Indications:              Screening for colorectal malignant neoplasm, This                            is the patient's first colonoscopy, Family history                            of colon cancer in a distant relative (Maternal                            grandfather) Medicines:                Monitored Anesthesia Care Procedure:                After obtaining informed consent, the colonoscope                            was passed under direct vision. Throughout the                            procedure, the patient's blood pressure, pulse, and                            oxygen saturations were monitored continuously. The                            CF [id number] #[id number] was introduced through the anus                            and advanced to the cecum, identified by                            appendiceal orifice and ileocecal valve. Scope In: 2:10:10 PM Scope Out: 2:38:31 PM Scope Withdrawal Time: 0 hours 20 minutes 10 seconds  Total Procedure Duration: 0 hours 28 minutes 21 seconds  Findings:                 Skin tags were found on perianal exam.                           The digital rectal exam was normal. Pertinent                            negatives include normal sphincter tone and no                            palpable rectal lesions.                           Two sessile polyps were found in the descending  colon and hepatic flexure. The polyps were 6 to 7                            mm in size. These polyps were removed with a cold                            snare. Resection and retrieval were complete.                           A 13 mm polyp was found in the transverse colon.                             The polyp was pedunculated. The polyp was removed                            with a hot snare. Resection and retrieval were                            complete.                           A few small-mouthed diverticula were found in the                            sigmoid colon.                           The retroflexed view of the distal rectum and anal                            verge was normal and showed no anal or rectal                            abnormalities. Complications:            No immediate complications. Estimated blood loss:                            Minimal. Estimated Blood Loss:     Estimated blood loss was minimal. Impression:               - Perianal skin tags found on perianal exam.                           - Two 6 to 7 mm polyps in the descending colon and                            at the hepatic flexure, removed with a cold snare.                            Resected and retrieved.                           - One 13 mm polyp in the transverse colon, removed  with a hot snare. Resected and retrieved.                           - Diverticulosis in the sigmoid colon. Recommendation:           - Discharge patient to home (ambulatory).                           - Await pathology results.                           - Repeat colonoscopy for surveillance based on                            pathology results.                           - The findings and recommendations were discussed                            with the patient's family.                           - Patient has a contact number available for                            emergencies. The signs and symptoms of potential                            delayed complications were discussed with the                            patient. Return to normal activities tomorrow.                            Written discharge instructions were provided to the                             patient. Inocente Hausen, MD 05/10/2024 2:44:53 PM This report has been signed electronically. Addendum Number: 1   Addendum Date: 05/23/2024 9:38:44 AM      The colonoscopy was performed without difficulty.      Quality of bowel preparation was good.      Landmarks phtographed: cecum, ICV, rectum. Inocente Hausen, MD 05/23/2024 9:40:13 AM This report has been signed electronically.

## 2024-05-13 ENCOUNTER — Telehealth: Payer: Self-pay | Admitting: Lactation Services

## 2024-05-13 NOTE — Telephone Encounter (Signed)
" °  Follow up Call-     05/10/2024   12:40 PM  Call back number  Post procedure Call Back phone  # [phone]  Permission to leave phone message Yes     Patient questions:  Do you have a fever, pain , or abdominal swelling? No. Pain Score  0 *  Have you tolerated food without any problems? Yes.    Have you been able to return to your normal activities? Yes.    Do you have any questions about your discharge instructions: Diet   No. Medications  No. Follow up visit  No.  Do you have questions or concerns about your Care? No.  Actions: * If pain score is 4 or above: No action needed, pain <4.    "

## 2024-05-20 LAB — SURGICAL PATHOLOGY

## 2024-05-23 ENCOUNTER — Ambulatory Visit: Payer: Self-pay | Admitting: Pediatrics

## 2024-06-24 ENCOUNTER — Other Ambulatory Visit: Payer: Self-pay | Admitting: Family Medicine

## 2024-06-24 DIAGNOSIS — J4540 Moderate persistent asthma, uncomplicated: Secondary | ICD-10-CM
